# Patient Record
Sex: Female | Born: 1935 | Race: Black or African American | Hispanic: No | Marital: Single | Smoking: Never smoker
Health system: Southern US, Community
[De-identification: ages and names within clinical notes are randomized; demographics above are authoritative.]

## PROBLEM LIST (undated history)

## (undated) DIAGNOSIS — N183 Chronic kidney disease, stage 3 unspecified: Secondary | ICD-10-CM

## (undated) DIAGNOSIS — C801 Malignant (primary) neoplasm, unspecified: Secondary | ICD-10-CM

## (undated) DIAGNOSIS — E785 Hyperlipidemia, unspecified: Secondary | ICD-10-CM

## (undated) DIAGNOSIS — F0390 Unspecified dementia without behavioral disturbance: Secondary | ICD-10-CM

## (undated) DIAGNOSIS — I10 Essential (primary) hypertension: Secondary | ICD-10-CM

## (undated) DIAGNOSIS — K219 Gastro-esophageal reflux disease without esophagitis: Secondary | ICD-10-CM

## (undated) HISTORY — PX: OTHER SURGICAL HISTORY: SHX169

---

## 2004-09-07 ENCOUNTER — Emergency Department: Payer: Self-pay | Admitting: Emergency Medicine

## 2005-04-20 ENCOUNTER — Emergency Department: Payer: Self-pay | Admitting: General Practice

## 2005-05-12 ENCOUNTER — Ambulatory Visit: Payer: Self-pay | Admitting: Internal Medicine

## 2009-08-04 ENCOUNTER — Ambulatory Visit: Payer: Self-pay | Admitting: Family Medicine

## 2009-08-25 ENCOUNTER — Ambulatory Visit: Payer: Self-pay | Admitting: Family Medicine

## 2009-09-05 ENCOUNTER — Emergency Department: Payer: Self-pay | Admitting: Emergency Medicine

## 2009-11-12 ENCOUNTER — Ambulatory Visit: Payer: Self-pay | Admitting: Family Medicine

## 2009-12-24 ENCOUNTER — Ambulatory Visit: Payer: Self-pay | Admitting: Family Medicine

## 2009-12-27 ENCOUNTER — Emergency Department: Payer: Self-pay | Admitting: Emergency Medicine

## 2010-03-16 ENCOUNTER — Emergency Department: Payer: Self-pay | Admitting: Emergency Medicine

## 2010-06-01 IMAGING — CT CT HEAD WITHOUT CONTRAST
2 series · 15 of 30 positions shown, 19 images · non-contrast
Comparison: none

REASON FOR EXAM: hypertensive, headache
COMMENTS:

PROCEDURE:     CT  - CT HEAD WITHOUT CONTRAST  - December 27, 2009  [DATE]
RESULT:     Comparison:  09/05/2009
TECHNIQUE: Multiple axial images from the foramen magnum to the vertex were
obtained without IV contrast.

[Series 4: without · axial · non-contrast · 0.46mm/px · z∈[-684,-558]mm · 13 of 31 slices shown, 17 images]
[im 3/31  brain]
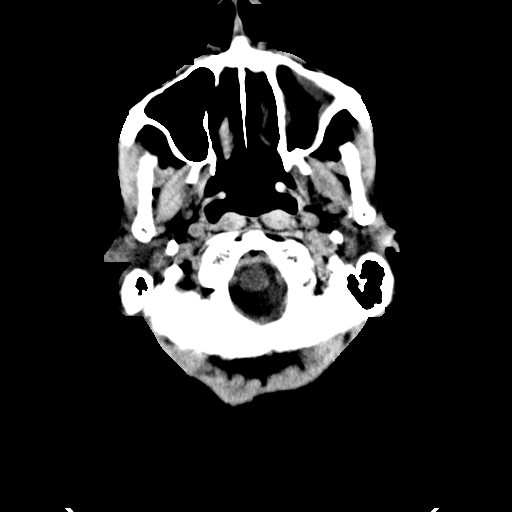
[im 3/31  bone]
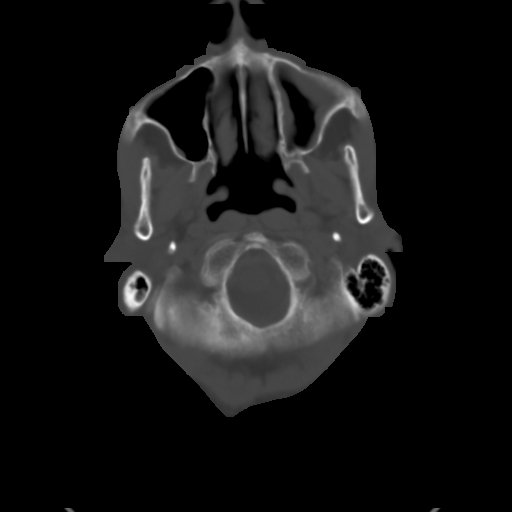
[im 5/31  brain]
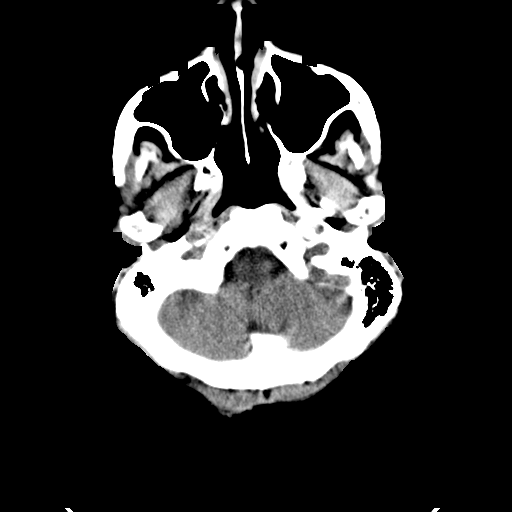
[im 7/31  brain]
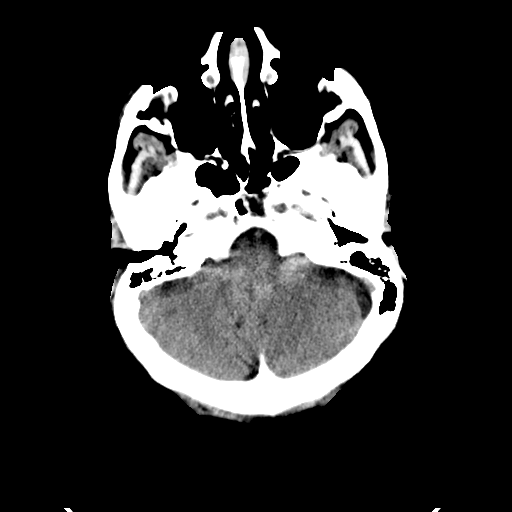
[im 9/31  brain]
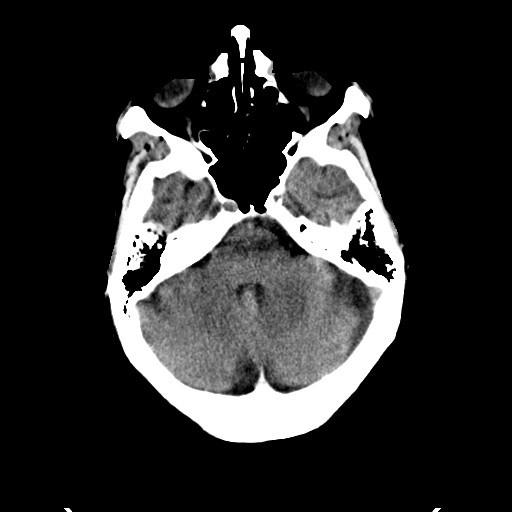
[im 11/31  brain]
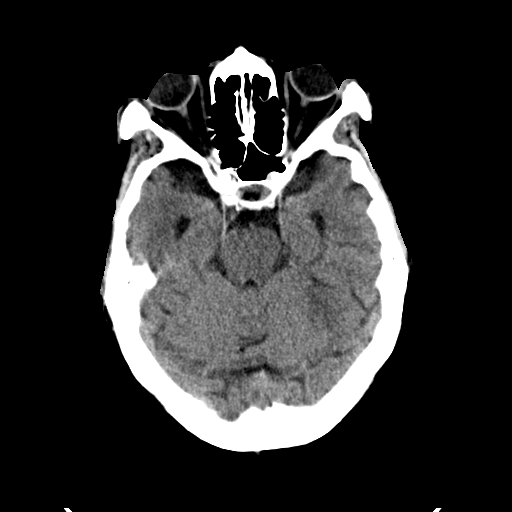
[im 11/31  bone]
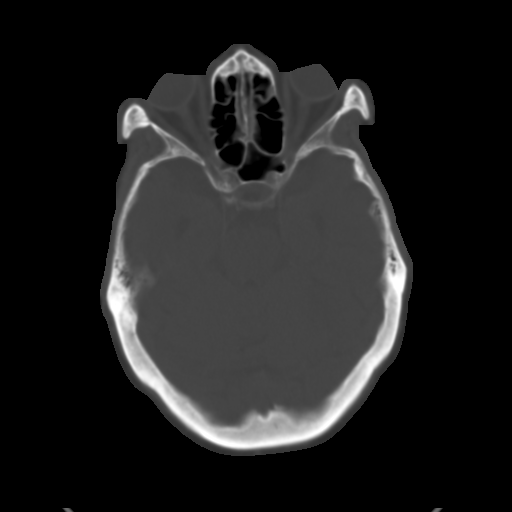
[im 13/31  brain]
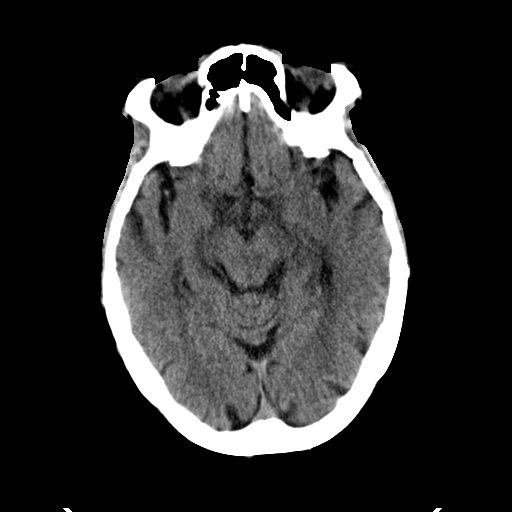
[im 16/31  brain]
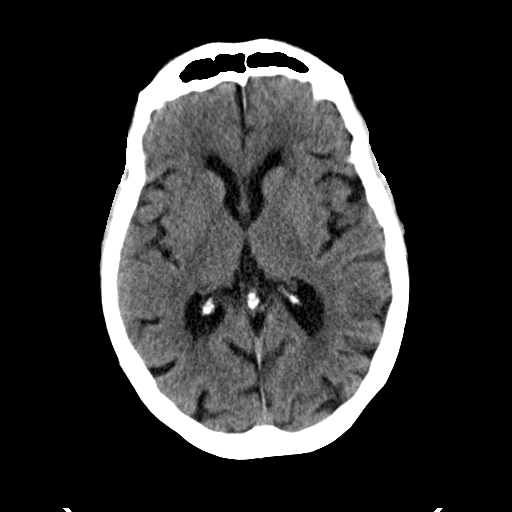
[im 18/31  brain]
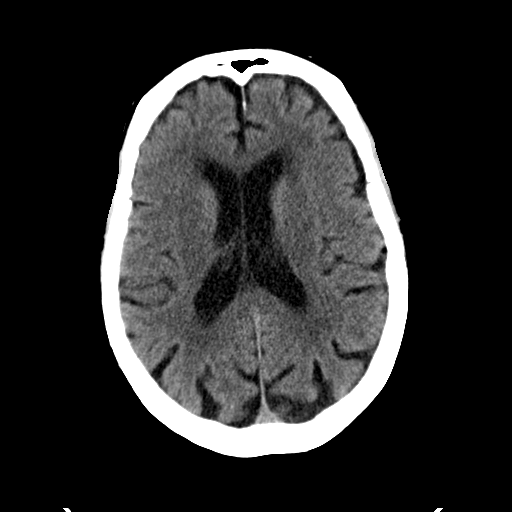
[im 20/31  brain]
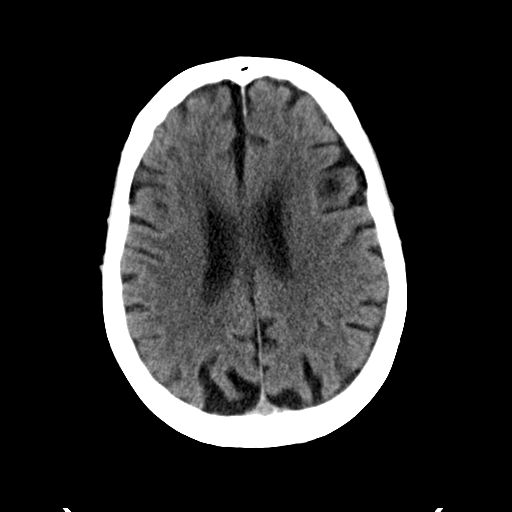
[im 20/31  bone]
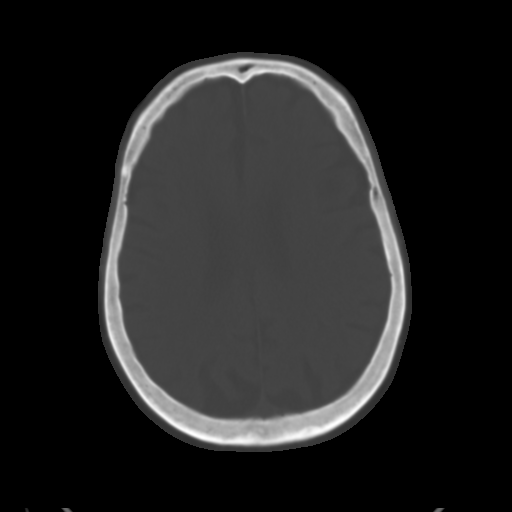
[im 22/31  brain]
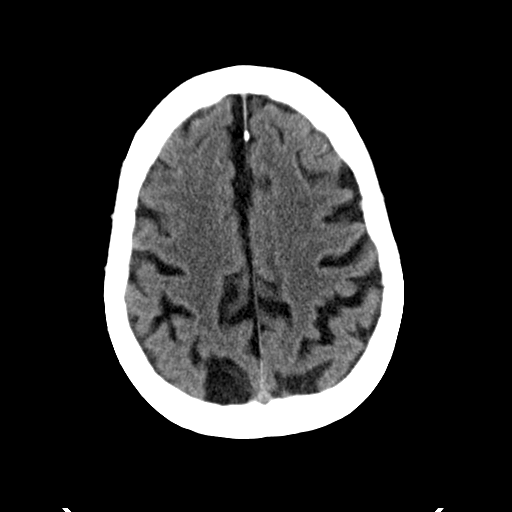
[im 24/31  brain]
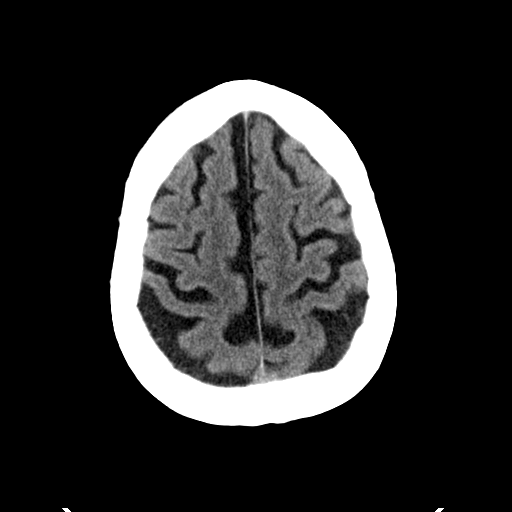
[im 26/31  brain]
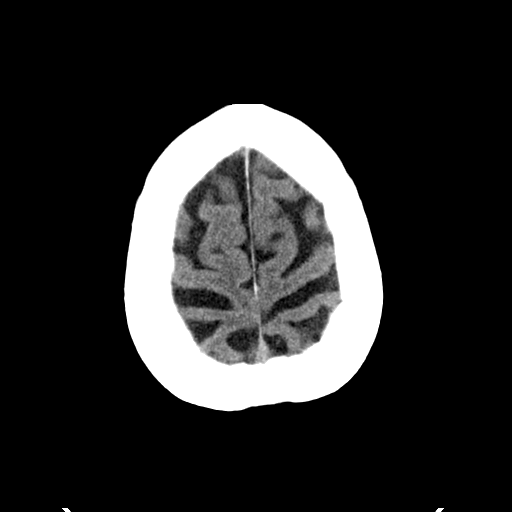
[im 28/31  brain]
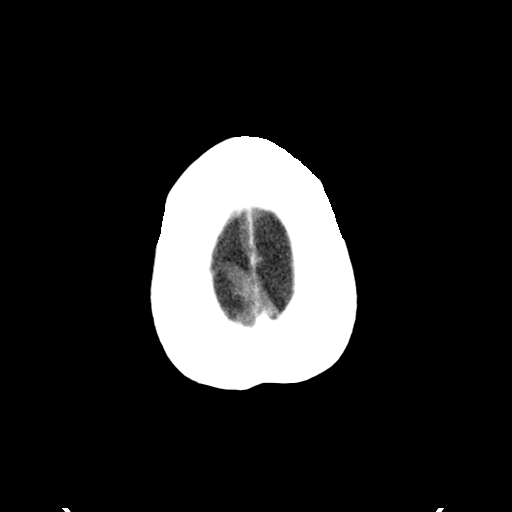
[im 28/31  bone]
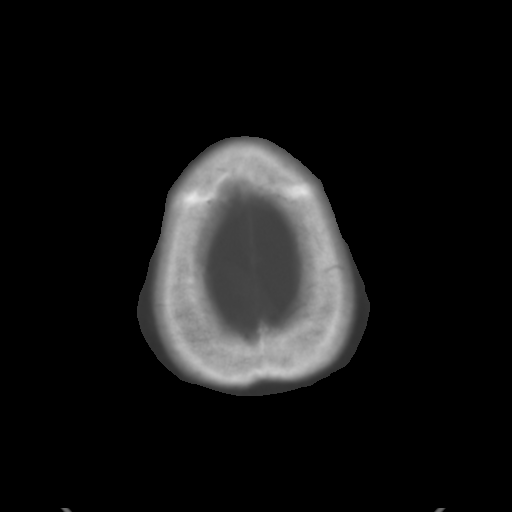

[Series 5: bone · axial · 0.46mm/px · z∈[-684,-664]mm · 2 of 31 slices shown]
[im 3/31  bone]
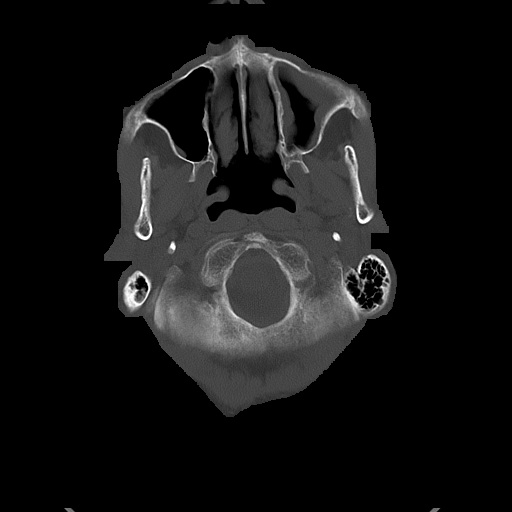
[im 7/31  bone]
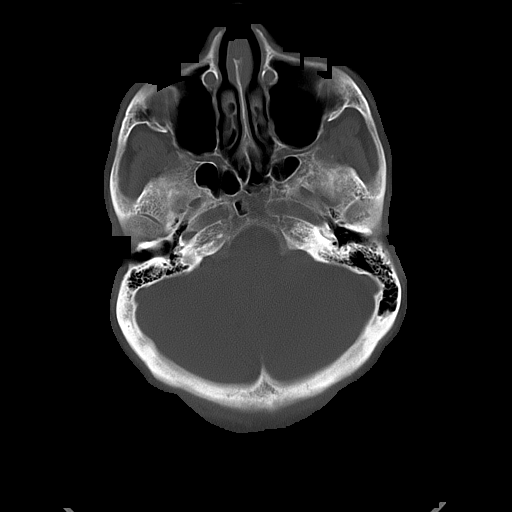

[15 of 30 positions shown; findings below may reference images not displayed]

FINDINGS: There is no evidence of mass effect, midline shift, or extra-axial fluid
collections.  There is no evidence of a space-occupying lesion or
intracranial hemorrhage. There is no evidence of a cortical-based area of
acute infarction. There is generalized cerebral atrophy. There is
periventricular white matter low attenuation likely secondary to
microangiopathy.

The ventricles and sulci are appropriate for the patient's age. The basal
cisterns are patent.

Visualized portions of the orbits are unremarkable. The visualized portions
of the paranasal sinuses and mastoid air cells are unremarkable.
Cerebrovascular atherosclerotic calcifications are noted.

The osseous structures are unremarkable.
IMPRESSION: No acute intracranial process.

## 2010-07-05 ENCOUNTER — Emergency Department: Payer: Self-pay | Admitting: Emergency Medicine

## 2010-12-10 ENCOUNTER — Emergency Department: Payer: Self-pay | Admitting: Emergency Medicine

## 2010-12-12 ENCOUNTER — Emergency Department: Payer: Self-pay | Admitting: Emergency Medicine

## 2010-12-22 ENCOUNTER — Emergency Department: Payer: Self-pay | Admitting: Emergency Medicine

## 2012-04-27 ENCOUNTER — Emergency Department: Payer: Self-pay | Admitting: Internal Medicine

## 2012-06-29 ENCOUNTER — Ambulatory Visit: Payer: Self-pay | Admitting: Oncology

## 2012-07-06 ENCOUNTER — Ambulatory Visit: Payer: Self-pay | Admitting: Oncology

## 2012-09-05 ENCOUNTER — Ambulatory Visit: Payer: Self-pay | Admitting: Oncology

## 2012-09-05 DIAGNOSIS — C801 Malignant (primary) neoplasm, unspecified: Secondary | ICD-10-CM

## 2012-09-05 HISTORY — DX: Malignant (primary) neoplasm, unspecified: C80.1

## 2013-09-22 ENCOUNTER — Emergency Department: Payer: Self-pay | Admitting: Emergency Medicine

## 2015-01-09 ENCOUNTER — Encounter: Payer: Self-pay | Admitting: Emergency Medicine

## 2015-01-09 ENCOUNTER — Emergency Department: Payer: Medicare Other

## 2015-01-09 ENCOUNTER — Other Ambulatory Visit: Payer: Self-pay

## 2015-01-09 ENCOUNTER — Emergency Department
Admission: EM | Admit: 2015-01-09 | Discharge: 2015-01-10 | Disposition: A | Discharge status: Home / Self Care | Payer: Medicare Other | Attending: Emergency Medicine | Admitting: Emergency Medicine

## 2015-01-09 DIAGNOSIS — R1110 Vomiting, unspecified: Secondary | ICD-10-CM | POA: Insufficient documentation

## 2015-01-09 DIAGNOSIS — R1084 Generalized abdominal pain: Secondary | ICD-10-CM

## 2015-01-09 DIAGNOSIS — R1111 Vomiting without nausea: Secondary | ICD-10-CM

## 2015-01-09 HISTORY — DX: Unspecified dementia, unspecified severity, without behavioral disturbance, psychotic disturbance, mood disturbance, and anxiety: F03.90

## 2015-01-09 HISTORY — DX: Malignant (primary) neoplasm, unspecified: C80.1

## 2015-01-09 LAB — CBC WITH DIFFERENTIAL/PLATELET
BASOS PCT: 1 %
Basophils Absolute: 0 10*3/uL (ref 0–0.1)
Eosinophils Absolute: 0.1 10*3/uL (ref 0–0.7)
Eosinophils Relative: 1 %
HCT: 31.8 % — ABNORMAL LOW (ref 35.0–47.0)
Hemoglobin: 10.5 g/dL — ABNORMAL LOW (ref 12.0–16.0)
Lymphocytes Relative: 26 %
Lymphs Abs: 2.1 10*3/uL (ref 1.0–3.6)
MCH: 29.6 pg (ref 26.0–34.0)
MCHC: 33 g/dL (ref 32.0–36.0)
MCV: 89.7 fL (ref 80.0–100.0)
Monocytes Absolute: 0.7 10*3/uL (ref 0.2–0.9)
Monocytes Relative: 8 %
NEUTROS PCT: 64 %
Neutro Abs: 5.3 10*3/uL (ref 1.4–6.5)
PLATELETS: 180 10*3/uL (ref 150–440)
RBC: 3.54 MIL/uL — ABNORMAL LOW (ref 3.80–5.20)
RDW: 13.6 % (ref 11.5–14.5)
WBC: 8.2 10*3/uL (ref 3.6–11.0)

## 2015-01-09 LAB — COMPREHENSIVE METABOLIC PANEL
ALBUMIN: 3.7 g/dL (ref 3.5–5.0)
ALT: 15 U/L (ref 14–54)
AST: 33 U/L (ref 15–41)
Alkaline Phosphatase: 83 U/L (ref 38–126)
Anion gap: 8 (ref 5–15)
BUN: 41 mg/dL — AB (ref 6–20)
CALCIUM: 8.9 mg/dL (ref 8.9–10.3)
CO2: 21 mmol/L — ABNORMAL LOW (ref 22–32)
CREATININE: 1.55 mg/dL — AB (ref 0.44–1.00)
Chloride: 111 mmol/L (ref 101–111)
GFR calc non Af Amer: 29 mL/min — ABNORMAL LOW (ref >60)
GFR, EST AFRICAN AMERICAN: 33 mL/min — AB (ref >60)
GLUCOSE: 139 mg/dL — AB (ref 65–99)
POTASSIUM: 4.4 mmol/L (ref 3.5–5.1)
Sodium: 140 mmol/L (ref 135–145)
Total Bilirubin: 0.6 mg/dL (ref 0.3–1.2)
Total Protein: 6.7 g/dL (ref 6.5–8.1)

## 2015-01-09 LAB — APTT: aPTT: 25 seconds (ref 24–36)

## 2015-01-09 LAB — LIPASE, BLOOD: Lipase: 66 U/L — ABNORMAL HIGH (ref 22–51)

## 2015-01-09 LAB — PROTIME-INR
INR: 0.98
Prothrombin Time: 13.2 seconds (ref 11.4–15.0)

## 2015-01-09 MED ORDER — SODIUM CHLORIDE 0.9 % IV SOLN
1000.0000 mL | Freq: Once | INTRAVENOUS | Status: AC
  Administered 2015-01-09: 1000 mL via INTRAVENOUS

## 2015-01-09 MED ORDER — ONDANSETRON HCL 4 MG/2ML IJ SOLN
INTRAMUSCULAR | Status: AC
  Administered 2015-01-09: 4 mg via INTRAVENOUS
  Filled 2015-01-09: qty 2

## 2015-01-09 MED ORDER — ONDANSETRON HCL 4 MG/2ML IJ SOLN
4.0000 mg | Freq: Once | INTRAMUSCULAR | Status: AC
  Administered 2015-01-09: 4 mg via INTRAVENOUS

## 2015-01-09 MED ORDER — IOHEXOL 300 MG/ML  SOLN
75.0000 mL | Freq: Once | INTRAMUSCULAR | Status: AC | PRN
  Administered 2015-01-09: 75 mL via INTRAVENOUS

## 2015-01-09 MED ORDER — IOHEXOL 240 MG/ML SOLN
25.0000 mL | Freq: Once | INTRAMUSCULAR | Status: AC | PRN
  Administered 2015-01-09: 25 mL via ORAL

## 2015-01-09 NOTE — ED Provider Notes (Addendum)
[facility] Emergency Department Provider Note    ____________________________________________  Time seen: On arrival with EMS.  I have reviewed the triage vital signs and the nursing notes.   HISTORY  Chief Complaint Emesis       HPI Hayley Campbell is a 81 y.o. female with a history of dementia and breast cancer who presents with vomiting.The patient denies any pain at this time. She vomited twice for EMS en route was found to be hypotensive. The vomitus was not bloody. The patient does not report any bloody stools as well.     Past Medical History  Diagnosis Date  . Cancer 2014    left breast cancer  . Dementia     There are no active problems to display for this patient.   Past Surgical History  Procedure Laterality Date  . None      No current outpatient prescriptions on file.  Allergies Review of patient's allergies indicates no known allergies.  History reviewed. No pertinent family history.  Social History History  Substance Use Topics  . Smoking status: Never Smoker   . Smokeless tobacco: Not on file  . Alcohol Use: No    Review of Systems  Constitutional: Negative for fever. Eyes: Negative for visual changes. ENT: Negative for sore throat. Cardiovascular: Negative for chest pain. Respiratory: Negative for shortness of breath. Gastrointestinal: Positive for vomiting.   Genitourinary: Negative for dysuria. Musculoskeletal: Negative for back pain. Skin: Negative for rash. Neurological: Negative for headaches, focal weakness or numbness.   10-point ROS otherwise negative.  ____________________________________________   PHYSICAL EXAM:  VITAL SIGNS: ED Triage Vitals  Enc Vitals Group     BP 01/09/15 2116 94/49 mmHg     Pulse Rate 01/09/15 2116 82     Resp --      Temp 01/09/15 2116 97.7 F (36.5 C)     Temp Source 01/09/15 2116 Oral     SpO2 01/09/15 2112 94 %     Weight 01/09/15 2116 144 lb 2.9 oz  (65.4 kg)     Height 01/09/15 2116 5\' 1"  (1.549 m)     Head Cir --      Peak Flow --      Pain Score --      Pain Loc --      Pain Edu? --      Excl. in [place]? --      Constitutional: Alert and oriented. However not oriented to the year which is the patient's baseline. Well appearing and in no distress. Eyes: Conjunctivae are normal. PERRL. Normal extraocular movements. ENT   Head: Normocephalic and atraumatic.   Nose: No congestion/rhinnorhea.   Mouth/Throat: Mucous membranes are moist.   Neck: No stridor. Hematological/Lymphatic/Immunilogical: No cervical lymphadenopathy. Cardiovascular: Normal rate, regular rhythm. Normal and symmetric distal pulses are present in all extremities. No murmurs, rubs, or gallops. Respiratory: Normal respiratory effort without tachypnea nor retractions. Breath sounds are clear and equal bilaterally. No wheezes/rales/rhonchi. Gastrointestinal: Diffusely tender abdomen which is soft and nondistended. Musculoskeletal: Nontender with normal range of motion in all extremities. No joint effusions.  No lower extremity tenderness nor edema. Neurologic:  Normal speech and language. No gross focal neurologic deficits are appreciated. Speech is normal. No gait instability. Skin:  Skin is warm, dry and intact. No rash noted. Psychiatric: Mood and affect are normal. Speech and behavior are normal. Patient exhibits appropriate insight and judgment.  ____________________________________________    LABS (pertinent positives/negatives)  Labs Reviewed  CBC WITH DIFFERENTIAL/PLATELET -  Abnormal; Notable for the following:    RBC 3.54 (*)    Hemoglobin 10.5 (*)    HCT 31.8 (*)    All other components within normal limits  COMPREHENSIVE METABOLIC PANEL - Abnormal; Notable for the following:    CO2 21 (*)    Glucose, Bld 139 (*)    BUN 41 (*)    Creatinine, Ser 1.55 (*)    GFR calc non Af Amer 29 (*)    GFR calc Af Amer 33 (*)    All other components  within normal limits  LIPASE, BLOOD - Abnormal; Notable for the following:    Lipase 66 (*)    All other components within normal limits  APTT  PROTIME-INR  URINALYSIS COMPLETEWITH MICROSCOPIC ([facility])   TROPONIN I   No previous for comparison.  ____________________________________________   EKG   Date: 01/10/2015  Rate: 82  Rhythm: normal sinus rhythm  QRS Axis: normal  Intervals: normal  ST/T Wave abnormalities: normal  Conduction Disutrbances: none  Narrative Interpretation: unremarkable      ____________________________________________    RADIOLOGY  2 cm cystic lesion in the pancreatic head  ____________________________________________   PROCEDURES   ____________________________________________   INITIAL IMPRESSION / ASSESSMENT AND PLAN / ED COURSE  Pertinent labs & imaging results that were available during my care of the patient were reviewed by me and considered in my medical decision making (see chart for details).  ----------------------------------------- 1:02 AM on 01/10/2015 -----------------------------------------  Patient is resting comfortably now. Is not having any more vomiting or complaining of pain. Was able tolerate by mouth fluids as well as applesauce. Still pending troponin and urinalysis. The patient's niece at the bedside Her the labs and imaging that we have obtained so far. He says that she has a known anemia. I also explained her kidney function was decreased. I spent with the patient is able to tolerate her food and her urine and other labs come back and a reassuring patient will likely be able to go home. The niece understands this plan and is willing to comply. Dr. Owens Shark to follow-up with remaining lab work.  ____________________________________________   FINAL CLINICAL IMPRESSION(S) / ED DIAGNOSES  Acute vomiting and abdominal pain. Initial visit.   Doran Stabler, MD 01/10/15 0103  Senna bowel movement with  grossly brown stool. Also observe vomiting earlier in the visit without any blood in the vomitus.  Doran Stabler, MD 01/10/15 [phone]

## 2015-01-09 NOTE — ED Notes (Signed)
Pt arrived via EMS from [facility]. Per EMS pt was in wheelchair and then started vomiting uncontrollably. Vomited x2 . Pt is alert to person and place.

## 2015-01-09 NOTE — ED Notes (Signed)
With CT 

## 2015-01-10 LAB — TROPONIN I: Troponin I: <0.03 ng/mL (ref <0.031)

## 2015-01-10 NOTE — ED Notes (Signed)
Pt. Was able to eat 4oz of applesauce and 8oz of grape juice and hold down for PO challenge.

## 2015-01-10 NOTE — ED Notes (Addendum)
Attempts for in and out unsuccessful.  Pt. Had medium bowel movement.  Cleaned up and changed pt.

## 2015-01-10 NOTE — Discharge Instructions (Signed)
Abdominal Pain °Many things can cause abdominal pain. Usually, abdominal pain is not caused by a disease and will improve without treatment. It can often be observed and treated at home. Your health care provider will do a physical exam and possibly order blood tests and X-rays to help determine the seriousness of your pain. However, in many cases, more time must pass before a clear cause of the pain can be found. Before that point, your health care provider may not know if you need more testing or further treatment. °HOME CARE INSTRUCTIONS  °Monitor your abdominal pain for any changes. The following actions may help to alleviate any discomfort you are experiencing: °· Only take over-the-counter or prescription medicines as directed by your health care provider. °· Do not take laxatives unless directed to do so by your health care provider. °· Try a clear liquid diet (broth, tea, or water) as directed by your health care provider. Slowly move to a bland diet as tolerated. °SEEK MEDICAL CARE IF: °· You have unexplained abdominal pain. °· You have abdominal pain associated with nausea or diarrhea. °· You have pain when you urinate or have a bowel movement. °· You experience abdominal pain that wakes you in the night. °· You have abdominal pain that is worsened or improved by eating food. °· You have abdominal pain that is worsened with eating fatty foods. °· You have a fever. °SEEK IMMEDIATE MEDICAL CARE IF:  °· Your pain does not go away within 2 hours. °· You keep throwing up (vomiting). °· Your pain is felt only in portions of the abdomen, such as the right side or the left lower portion of the abdomen. °· You pass bloody or black tarry stools. °MAKE SURE YOU: °· Understand these instructions.   °· Will watch your condition.   °· Will get help right away if you are not doing well or get worse.   °Document Released: 06/01/2005 Document Revised: 08/27/2013 Document Reviewed: 05/01/2013 °[employer]® Patient Information  ©2015 [employer]. This information is not intended to replace advice given to you by your health care provider. Make sure you discuss any questions you have with your health care provider. ° °Nausea and Vomiting °Nausea is a sick feeling that often comes before throwing up (vomiting). Vomiting is a reflex where stomach contents come out of your mouth. Vomiting can cause severe loss of body fluids (dehydration). Children and elderly adults can become dehydrated quickly, especially if they also have diarrhea. Nausea and vomiting are symptoms of a condition or disease. It is important to find the cause of your symptoms. °CAUSES  °· Direct irritation of the stomach lining. This irritation can result from increased acid production (gastroesophageal reflux disease), infection, food poisoning, taking certain medicines (such as nonsteroidal anti-inflammatory drugs), alcohol use, or tobacco use. °· Signals from the brain. These signals could be caused by a headache, heat exposure, an inner ear disturbance, increased pressure in the brain from injury, infection, a tumor, or a concussion, pain, emotional stimulus, or metabolic problems. °· An obstruction in the gastrointestinal tract (bowel obstruction). °· Illnesses such as diabetes, hepatitis, gallbladder problems, appendicitis, kidney problems, cancer, sepsis, atypical symptoms of a heart attack, or eating disorders. °· Medical treatments such as chemotherapy and radiation. °· Receiving medicine that makes you sleep (general anesthetic) during surgery. °DIAGNOSIS °Your caregiver may ask for tests to be done if the problems do not improve after a few days. Tests may also be done if symptoms are severe or if the reason for the nausea   and vomiting is not clear. Tests may include: °· Urine tests. °· Blood tests. °· Stool tests. °· Cultures (to look for evidence of infection). °· X-rays or other imaging studies. °Test results can help your caregiver make decisions about  treatment or the need for additional tests. °TREATMENT °You need to stay well hydrated. Drink frequently but in small amounts. You may wish to drink water, sports drinks, clear broth, or eat frozen ice pops or gelatin dessert to help stay hydrated. When you eat, eating slowly may help prevent nausea. There are also some antinausea medicines that may help prevent nausea. °HOME CARE INSTRUCTIONS  °· Take all medicine as directed by your caregiver. °· If you do not have an appetite, do not force yourself to eat. However, you must continue to drink fluids. °· If you have an appetite, eat a normal diet unless your caregiver tells you differently. °¨ Eat a variety of complex carbohydrates (rice, wheat, potatoes, bread), lean meats, yogurt, fruits, and vegetables. °¨ Avoid high-fat foods because they are more difficult to digest. °· Drink enough water and fluids to keep your urine clear or pale yellow. °· If you are dehydrated, ask your caregiver for specific rehydration instructions. Signs of dehydration may include: °¨ Severe thirst. °¨ Dry lips and mouth. °¨ Dizziness. °¨ Dark urine. °¨ Decreasing urine frequency and amount. °¨ Confusion. °¨ Rapid breathing or pulse. °SEEK IMMEDIATE MEDICAL CARE IF:  °· You have blood or brown flecks (like coffee grounds) in your vomit. °· You have black or bloody stools. °· You have a severe headache or stiff neck. °· You are confused. °· You have severe abdominal pain. °· You have chest pain or trouble breathing. °· You do not urinate at least once every 8 hours. °· You develop cold or clammy skin. °· You continue to vomit for longer than 24 to 48 hours. °· You have a fever. °MAKE SURE YOU:  °· Understand these instructions. °· Will watch your condition. °· Will get help right away if you are not doing well or get worse. °Document Released: 08/22/2005 Document Revised: 11/14/2011 Document Reviewed: 01/19/2011 °ExitCare® Patient Information ©2015 [employer]. This information is not  intended to replace advice given to you by your health care provider. Make sure you discuss any questions you have with your health care provider. ° °

## 2015-11-26 ENCOUNTER — Encounter: Payer: Self-pay | Admitting: Emergency Medicine

## 2015-11-26 ENCOUNTER — Emergency Department
Admission: EM | Admit: 2015-11-26 | Discharge: 2015-11-26 | Disposition: A | Discharge status: Skilled Nursing Facility | Payer: Medicare Other | Attending: Emergency Medicine | Admitting: Emergency Medicine

## 2015-11-26 ENCOUNTER — Emergency Department: Payer: Medicare Other

## 2015-11-26 DIAGNOSIS — R531 Weakness: Secondary | ICD-10-CM

## 2015-11-26 DIAGNOSIS — I959 Hypotension, unspecified: Secondary | ICD-10-CM | POA: Diagnosis present

## 2015-11-26 DIAGNOSIS — F0390 Unspecified dementia without behavioral disturbance: Secondary | ICD-10-CM | POA: Insufficient documentation

## 2015-11-26 DIAGNOSIS — Z853 Personal history of malignant neoplasm of breast: Secondary | ICD-10-CM | POA: Diagnosis not present

## 2015-11-26 LAB — CBC WITH DIFFERENTIAL/PLATELET
Basophils Absolute: 0 10*3/uL (ref 0–0.1)
Basophils Relative: 0 %
Eosinophils Absolute: 0.1 10*3/uL (ref 0–0.7)
Eosinophils Relative: 0 %
HEMATOCRIT: 31.7 % — AB (ref 35.0–47.0)
HEMOGLOBIN: 10.4 g/dL — AB (ref 12.0–16.0)
LYMPHS ABS: 1.2 10*3/uL (ref 1.0–3.6)
Lymphocytes Relative: 11 %
MCH: 29.3 pg (ref 26.0–34.0)
MCHC: 32.7 g/dL (ref 32.0–36.0)
MCV: 89.6 fL (ref 80.0–100.0)
MONOS PCT: 5 %
Monocytes Absolute: 0.6 10*3/uL (ref 0.2–0.9)
NEUTROS ABS: 9.5 10*3/uL — AB (ref 1.4–6.5)
NEUTROS PCT: 84 %
Platelets: 197 10*3/uL (ref 150–440)
RBC: 3.54 MIL/uL — ABNORMAL LOW (ref 3.80–5.20)
RDW: 13.4 % (ref 11.5–14.5)
WBC: 11.4 10*3/uL — ABNORMAL HIGH (ref 3.6–11.0)

## 2015-11-26 LAB — COMPREHENSIVE METABOLIC PANEL
ALK PHOS: 83 U/L (ref 38–126)
ALT: 14 U/L (ref 14–54)
ANION GAP: 5 (ref 5–15)
AST: 25 U/L (ref 15–41)
Albumin: 3.5 g/dL (ref 3.5–5.0)
BILIRUBIN TOTAL: 0.7 mg/dL (ref 0.3–1.2)
BUN: 40 mg/dL — ABNORMAL HIGH (ref 6–20)
CALCIUM: 8.7 mg/dL — AB (ref 8.9–10.3)
CO2: 19 mmol/L — ABNORMAL LOW (ref 22–32)
Chloride: 111 mmol/L (ref 101–111)
Creatinine, Ser: 1.15 mg/dL — ABNORMAL HIGH (ref 0.44–1.00)
GFR calc non Af Amer: 41 mL/min — ABNORMAL LOW (ref >60)
GFR, EST AFRICAN AMERICAN: 47 mL/min — AB (ref >60)
Glucose, Bld: 136 mg/dL — ABNORMAL HIGH (ref 65–99)
Potassium: 4.9 mmol/L (ref 3.5–5.1)
Sodium: 135 mmol/L (ref 135–145)
TOTAL PROTEIN: 6.6 g/dL (ref 6.5–8.1)

## 2015-11-26 LAB — URINALYSIS COMPLETE WITH MICROSCOPIC (ARMC ONLY)
Bilirubin Urine: NEGATIVE
GLUCOSE, UA: NEGATIVE mg/dL
Ketones, ur: NEGATIVE mg/dL
Leukocytes, UA: NEGATIVE
NITRITE: NEGATIVE
Protein, ur: NEGATIVE mg/dL
SPECIFIC GRAVITY, URINE: 1.01 (ref 1.005–1.030)
pH: 6 (ref 5.0–8.0)

## 2015-11-26 LAB — TROPONIN I: Troponin I: <0.03 ng/mL (ref <0.031)

## 2015-11-26 MED ORDER — DEXTROSE 5 % IV SOLN
1.0000 g | Freq: Once | INTRAVENOUS | Status: AC
  Administered 2015-11-26: 1 g via INTRAVENOUS
  Filled 2015-11-26: qty 10

## 2015-11-26 MED ORDER — SODIUM CHLORIDE 0.9 % IV SOLN
1000.0000 mL | Freq: Once | INTRAVENOUS | Status: AC
  Administered 2015-11-26: 1000 mL via INTRAVENOUS

## 2015-11-26 NOTE — Discharge Instructions (Signed)

## 2015-11-26 NOTE — ED Notes (Signed)
Attempted in/out cath 3 times unsuccessfully.  Dr. Jimmye Norman aware.  Pt placed on bedpan to hopefully obtain a sample.

## 2015-11-26 NOTE — ED Notes (Signed)
Pt lives in [facility] skilled nursing facility and was found unresponsive this morning.  Staff states that her coloring is different and is unable to stand (baseline walks with no devices).  EMS reports BP of 74/40 with manual 80/40.  BG normal in EMS truck.  EMS started normal saline bolus.  Pt has history of hypertension and dementia.

## 2015-11-26 NOTE — ED Provider Notes (Signed)
[facility] Emergency Department Provider Note     Time seen: ----------------------------------------- 8:20 AM on 11/26/2015 -----------------------------------------  L5 caveat: Review of systems and history is limited by dementia  I have reviewed the triage vital signs and the nursing notes.   HISTORY  Chief Complaint Hypotension    HPI Hayley Campbell is a 82 y.o. female who presents to ER being brought from a nursing home after she was found unresponsive this morning. Staff states she looked different and was unable to stand. She typically walks without any assistance. EMS arrived to find blood pressure low. EMS started a saline bolus, patient denies any complaints at this time but is demented.   Past Medical History  Diagnosis Date  . Cancer [facility]) 2014    left breast cancer  . Dementia     There are no active problems to display for this patient.   Past Surgical History  Procedure Laterality Date  . None      Allergies Review of patient's allergies indicates no known allergies.  Social History Social History  Substance Use Topics  . Smoking status: Never Smoker   . Smokeless tobacco: None  . Alcohol Use: No    Review of Systems Unknown at this time  ____________________________________________   PHYSICAL EXAM:  VITAL SIGNS: ED Triage Vitals  Enc Vitals Group     BP 11/26/15 0800 108/59 mmHg     Pulse Rate 11/26/15 0800 77     Resp --      Temp 11/26/15 0800 97.5 F (36.4 C)     Temp Source 11/26/15 0800 Oral     SpO2 11/26/15 0800 98 %     Weight 11/26/15 0800 130 lb (58.968 kg)     Height 11/26/15 0800 5\' 4"  (1.626 m)     Head Cir --      Peak Flow --      Pain Score --      Pain Loc --      Pain Edu? --      Excl. in [place]? --     Constitutional: Alert But disoriented, Well appearing and in no distress. Eyes: Conjunctivae are normal. PERRL. Normal extraocular movements. ENT   Head: Normocephalic and  atraumatic.   Nose: No congestion/rhinnorhea.   Mouth/Throat: Mucous membranes are moist.   Neck: No stridor. Cardiovascular: Normal rate, regular rhythm. Normal and symmetric distal pulses are present in all extremities. No murmurs, rubs, or gallops. Respiratory: Normal respiratory effort without tachypnea nor retractions. Breath sounds are clear and equal bilaterally. No wheezes/rales/rhonchi. Gastrointestinal: Soft and nontender. No distention. No abdominal bruits.  Musculoskeletal: Nontender with normal range of motion in all extremities. No joint effusions.  No lower extremity tenderness nor edema. Neurologic:  Normal speech and language. No gross focal neurologic deficits are appreciated.  Skin:  Skin is warm, dry and intact. No rash noted. Psychiatric: Mood and affect are normal.  ____________________________________________  EKG: Interpreted by me. Normal sinus rhythm with a rate of 77 bpm, normal PR interval, normal QRS, normal QT interval. Normal axis.  ____________________________________________  ED COURSE:  Pertinent labs & imaging results that were available during my care of the patient were reviewed by me and considered in my medical decision making (see chart for details). Patient is no acute distress, will check basic labs, continue IV fluid bolus and reevaluate. ____________________________________________    LABS (pertinent positives/negatives)  Labs Reviewed  CBC WITH DIFFERENTIAL/PLATELET - Abnormal; Notable for the following:    WBC  11.4 (*)    RBC 3.54 (*)    Hemoglobin 10.4 (*)    HCT 31.7 (*)    Neutro Abs 9.5 (*)    All other components within normal limits  COMPREHENSIVE METABOLIC PANEL - Abnormal; Notable for the following:    CO2 19 (*)    Glucose, Bld 136 (*)    BUN 40 (*)    Creatinine, Ser 1.15 (*)    Calcium 8.7 (*)    GFR calc non Af Amer 41 (*)    GFR calc Af Amer 47 (*)    All other components within normal limits  URINALYSIS  COMPLETEWITH MICROSCOPIC ([facility] ONLY) - Abnormal; Notable for the following:    Color, Urine YELLOW (*)    APPearance CLEAR (*)    Hgb urine dipstick 3+ (*)    Bacteria, UA RARE (*)    Squamous Epithelial / LPF 6-30 (*)    All other components within normal limits  URINE CULTURE  TROPONIN I    RADIOLOGY  IMPRESSION: Limited study by poor inspiration. No infiltrate or pulmonary edema. Mild basilar atelectasis.  ____________________________________________  FINAL ASSESSMENT AND PLAN  Weakness  Plan: Patient with labs and imaging as dictated above. No clear etiology for the events that occurred earlier. She has been awake and alert and normotensive throughout her stay. She has received a liter fluid as well as a gram of Rocephin. I will advised close outpatient follow-up with her doctor.   Earleen Newport, MD   Earleen Newport, MD 11/26/15 [phone]

## 2015-11-28 LAB — URINE CULTURE: Special Requests: NORMAL

## 2016-01-29 ENCOUNTER — Emergency Department
Admission: EM | Admit: 2016-01-29 | Discharge: 2016-01-30 | Disposition: A | Discharge status: Home / Self Care | Payer: Medicare Other | Attending: Emergency Medicine | Admitting: Emergency Medicine

## 2016-01-29 DIAGNOSIS — Z853 Personal history of malignant neoplasm of breast: Secondary | ICD-10-CM | POA: Diagnosis not present

## 2016-01-29 DIAGNOSIS — R55 Syncope and collapse: Secondary | ICD-10-CM | POA: Diagnosis not present

## 2016-01-29 DIAGNOSIS — R531 Weakness: Secondary | ICD-10-CM | POA: Diagnosis present

## 2016-01-29 DIAGNOSIS — Z79899 Other long term (current) drug therapy: Secondary | ICD-10-CM | POA: Diagnosis not present

## 2016-01-29 LAB — CBC
HEMATOCRIT: 32.1 % — AB (ref 35.0–47.0)
Hemoglobin: 10.8 g/dL — ABNORMAL LOW (ref 12.0–16.0)
MCH: 29.8 pg (ref 26.0–34.0)
MCHC: 33.8 g/dL (ref 32.0–36.0)
MCV: 88.2 fL (ref 80.0–100.0)
PLATELETS: 224 10*3/uL (ref 150–440)
RBC: 3.64 MIL/uL — ABNORMAL LOW (ref 3.80–5.20)
RDW: 13.4 % (ref 11.5–14.5)
WBC: 8.1 10*3/uL (ref 3.6–11.0)

## 2016-01-29 LAB — BASIC METABOLIC PANEL
Anion gap: 6 (ref 5–15)
BUN: 53 mg/dL — AB (ref 6–20)
CO2: 23 mmol/L (ref 22–32)
CREATININE: 1.28 mg/dL — AB (ref 0.44–1.00)
Calcium: 9.7 mg/dL (ref 8.9–10.3)
Chloride: 111 mmol/L (ref 101–111)
GFR calc Af Amer: 41 mL/min — ABNORMAL LOW (ref >60)
GFR, EST NON AFRICAN AMERICAN: 36 mL/min — AB (ref >60)
GLUCOSE: 103 mg/dL — AB (ref 65–99)
POTASSIUM: 5.1 mmol/L (ref 3.5–5.1)
SODIUM: 140 mmol/L (ref 135–145)

## 2016-01-29 NOTE — ED Notes (Addendum)
Pt arrives to ED via [facility] from [facility] with c/o "being slumped over". EMS states the facility found the pt with "her chin to her chest" and it took "5 mins to arouse her". EMS reports BP of 118/74. Pt is alert, but confused to date, situation, and location. Pt's skin is WPD and respirations are even, regular, and unlabored.

## 2016-01-30 MED ORDER — FOSFOMYCIN TROMETHAMINE 3 G PO PACK: 3.0000 g | PACK | Freq: Once | ORAL | Status: DC

## 2016-01-30 MED ORDER — FOSFOMYCIN TROMETHAMINE 3 G PO PACK
3.0000 g | PACK | ORAL | Status: AC
  Administered 2016-01-30: 3 g via ORAL
  Filled 2016-01-30: qty 3

## 2016-01-30 MED ORDER — SODIUM CHLORIDE 0.9 % IV BOLUS (SEPSIS)
500.0000 mL | Freq: Once | INTRAVENOUS | Status: AC
  Administered 2016-01-30: 500 mL via INTRAVENOUS

## 2016-01-30 NOTE — ED Provider Notes (Signed)
[facility] Emergency Department Provider Note  ____________________________________________  Time seen: Approximately 2:56 AM  I have reviewed the triage vital signs and the nursing notes.   HISTORY  Chief Complaint Weakness    HPI Hayley Campbell is a 82 y.o. female presents for evaluation of an episode where she was found slumped forward and difficult to arouse.  The patient is not able to provide significant history or clear clinical picture, but healthcare power of attorney is present and she is able to provide. She reports that she was called by the nursing home as the patient was slumped forward and they had difficulty arousing her for about 3-5 minutes. She reports she is not sure if she actually wanted the patient is so they sent to the ER, but did ultimately decide to send her for evaluation.  In further discussion she reports that the patient is DO NOT RESUSCITATE, and not sure that she would even wish for her to be hospitalized.   Past Medical History  Diagnosis Date  . Cancer [facility]) 2014    left breast cancer  . Dementia     There are no active problems to display for this patient.   Past Surgical History  Procedure Laterality Date  . None      Current Outpatient Rx  Name  Route  Sig  Dispense  Refill  . Cholecalciferol (VITAMIN D3) 50000 units CAPS   Oral   Take 50,000 Units by mouth every 30 (thirty) days. On the 14th of every month         . fosfomycin (MONUROL) 3 g PACK   Oral   Take 3 g by mouth once. Please mix in 8 oz of water, take by mouth once   3 g   0   . letrozole (FEMARA) 2.5 MG tablet   Oral   Take 2.5 mg by mouth daily.         Marland Kitchen lisinopril (PRINIVIL,ZESTRIL) 40 MG tablet   Oral   Take 40 mg by mouth daily.         Marland Kitchen omeprazole (PRILOSEC) 20 MG capsule   Oral   Take 20 mg by mouth daily. Take one hour before breakfast         . spironolactone (ALDACTONE) 25 MG tablet   Oral   Take 25 mg by  mouth daily.         . traZODone (DESYREL) 50 MG tablet   Oral   Take 25 mg by mouth at bedtime.           Allergies Darvon  No family history on file.  Social History Social History  Substance Use Topics  . Smoking status: Never Smoker   . Smokeless tobacco: Not on file  . Alcohol Use: No    Review of Systems EM caveat: Patient unable to provide history due to dementia Per caretaker patient and her regular state of health, but does have episodes where she will have weakness the last for a few minutes or some staring-type spells from time to time without clear diagnosis.  ____________________________________________   PHYSICAL EXAM:  VITAL SIGNS: ED Triage Vitals  Enc Vitals Group     BP 01/29/16 2149 101/43 mmHg     Pulse Rate 01/29/16 2149 81     Resp 01/29/16 2149 16     Temp 01/29/16 2149 98 F (36.7 C)     Temp Source 01/29/16 2149 Oral     SpO2 01/29/16 2149 100 %  Weight 01/29/16 2149 138 lb 11.2 oz (62.914 kg)     Height 01/29/16 2149 5' (1.524 m)     Head Cir --      Peak Flow --      Pain Score 01/29/16 2150 0     Pain Loc --      Pain Edu? --      Excl. in [place]? --    Constitutional: Alert and orientedTo self and caretaker but not to place or date. Well appearing and in no acute distress. She sits up, follows commands and is conversant. Eyes: Conjunctivae are normal. PERRL. EOMI. Head: Atraumatic. Nose: No congestion/rhinnorhea. Mouth/Throat: Mucous membranes are slightly dry.  Oropharynx non-erythematous. Neck: No stridor.   Cardiovascular: Normal rate, regular rhythm. Grossly normal heart sounds.  Good peripheral circulation. Respiratory: Normal respiratory effort.  No retractions. Lungs CTAB. Gastrointestinal: Soft and nontender. No distention. No abdominal bruits. No CVA tenderness. Musculoskeletal: No lower extremity tenderness nor edema.  No joint effusions. Moves all extremities well 5 out of 5 to commands. Neurologic:  Normal speech  and language. No gross focal neurologic deficits are appreciated. No facial droop. She smiles, is pleasant and conversant. Skin:  Skin is warm, dry and intact. No rash noted. Psychiatric: Mood and affect are normal. Speech and behavior are normal.  ____________________________________________   LABS (all labs ordered are listed, but only abnormal results are displayed)  Labs Reviewed  BASIC METABOLIC PANEL - Abnormal; Notable for the following:    Glucose, Bld 103 (*)    BUN 53 (*)    Creatinine, Ser 1.28 (*)    GFR calc non Af Amer 36 (*)    GFR calc Af Amer 41 (*)    All other components within normal limits  CBC - Abnormal; Notable for the following:    RBC 3.64 (*)    Hemoglobin 10.8 (*)    HCT 32.1 (*)    All other components within normal limits  URINALYSIS COMPLETEWITH MICROSCOPIC ([facility] ONLY)   ____________________________________________  EKG  ED ECG REPORT I, Careen Mauch, the attending physician, personally viewed and interpreted this ECG.  Date: 01/29/2016 EKG Time: 2200 Rhythm: normal sinus rhythm QRS Axis: normal Intervals: normal ST/T Wave abnormalities: normal Conduction Disturbances: none Narrative Interpretation: unremarkable  ____________________________________________  RADIOLOGY  Discussed risks and benefits of CT of the head with the patient's healthcare power of attorney including that CT could help [place] evaluate for a possible "stroke" or brain bleed" or other cause of  _ exposure healthcare power of attorney advises that they wished to minimize testing and that even if the patient had something going wrong they would not wish to change her current treatment plan and she would never authorize her to have any type of brain surgery. Based on this and shared medical decision making I answered questions related to CT of the head and benefits and we will not perform CT head at the time. Based on the goals of care as discussed with her healthcare power of  attorney I think this is consistent and appropriate.___________________________________________   PROCEDURES  Procedure(s) performed: None  Critical Care performed: No  ____________________________________________   INITIAL IMPRESSION / ASSESSMENT AND PLAN / ED COURSE  Pertinent labs & imaging results that were available during my care of the patient were reviewed by me and considered in my medical decision making (see chart for details).  The patient reportedly had an episode of near-syncope, or severe weakness and slumping forward that lasted 3-5 minutes. This  has resolved and she is at her baseline mental status. Laboratory evaluation appears stable, CT of the head was considered but not desire by healthcare power of attorney and I think this is reasonable even the patient's care goals and discussion with her power of attorney.  Attempted in and out catheter, but patient was uncomfortable with that and thus stopped. After further discussion healthcare power of attorney requests that the goal of stays that since the patient seems to be her regular status now to get her back to her care facility and not perform any further testing but she has a history of urinary tract infection and given this I discussed with and we will treat her empirically for possible urinary tract infection. I did offer further evaluation including admission for observation, CT of the head, and further testing but it became clear discussing with power of attorney that the goal of care is comfort primarily, and the patient will be returned to her care facility without further intervention or admission. ____________________________________________   FINAL CLINICAL IMPRESSION(S) / ED DIAGNOSES  Final diagnoses:  Weakness generalized  Near syncope      Delman Kitten, MD 01/30/16 [phone]

## 2016-01-30 NOTE — Discharge Instructions (Signed)
It is unclear, but we will treat for possible urinary tract infection and if hydrated Hayley Campbell well.  We discussed admission, based on our discussion and we will not admit her but rather send her back to her care facility consistent with her wishes as well as those of her health care power of attorney.  Please bring patient back if her symptoms are worsening, she has another episode where she becomes near unresponsive, she has fever, vomiting, increased confusion or new concerns arise.  Weakness Weakness is a lack of strength. It may be felt all over the body (generalized) or in one specific part of the body (focal). Some causes of weakness can be serious. You may need further medical evaluation, especially if you are elderly or you have a history of immunosuppression (such as chemotherapy or HIV), kidney disease, heart disease, or diabetes. CAUSES  Weakness can be caused by many different things, including:  Infection.  Physical exhaustion.  Internal bleeding or other blood loss that results in a lack of red blood cells (anemia).  Dehydration. This cause is more common in elderly people.  Side effects or electrolyte abnormalities from medicines, such as pain medicines or sedatives.  Emotional distress, anxiety, or depression.  Circulation problems, especially severe peripheral arterial disease.  Heart disease, such as rapid atrial fibrillation, bradycardia, or heart failure.  Nervous system disorders, such as Guillain-Barr syndrome, multiple sclerosis, or stroke. DIAGNOSIS  To find the cause of your weakness, your caregiver will take your history and perform a physical exam. Lab tests or X-rays may also be ordered, if needed. TREATMENT  Treatment of weakness depends on the cause of your symptoms and can vary greatly. HOME CARE INSTRUCTIONS   Rest as needed.  Eat a well-balanced diet.  Try to get some exercise every day.  Only take over-the-counter or prescription  medicines as directed by your caregiver. SEEK MEDICAL CARE IF:   Your weakness seems to be getting worse or spreads to other parts of your body.  You develop new aches or pains. SEEK IMMEDIATE MEDICAL CARE IF:   You cannot perform your normal daily activities, such as getting dressed and feeding yourself.  You cannot walk up and down stairs, or you feel exhausted when you do so.  You have shortness of breath or chest pain.  You have difficulty moving parts of your body.  You have weakness in only one area of the body or on only one side of the body.  You have a fever.  You have trouble speaking or swallowing.  You cannot control your bladder or bowel movements.  You have black or bloody vomit or stools. MAKE SURE YOU:  Understand these instructions.  Will watch your condition.  Will get help right away if you are not doing well or get worse.   This information is not intended to replace advice given to you by your health care provider. Make sure you discuss any questions you have with your health care provider.   Document Released: 08/22/2005 Document Revised: 02/21/2012 Document Reviewed: 10/21/2011 Elsevier Interactive Patient Education [employer].

## 2016-01-30 NOTE — ED Notes (Signed)
2 attempts (Tammy, EDT and Samantha, EDT) to I&O cath pt for urine sample unsuccessful; Dr Jacqualine Code made aware.

## 2016-02-05 ENCOUNTER — Emergency Department
Admission: EM | Admit: 2016-02-05 | Discharge: 2016-02-05 | Disposition: A | Discharge status: Home / Self Care | Payer: Medicare Other | Attending: Emergency Medicine | Admitting: Emergency Medicine

## 2016-02-05 ENCOUNTER — Encounter: Payer: Self-pay | Admitting: Emergency Medicine

## 2016-02-05 ENCOUNTER — Emergency Department: Payer: Medicare Other

## 2016-02-05 DIAGNOSIS — Z853 Personal history of malignant neoplasm of breast: Secondary | ICD-10-CM | POA: Insufficient documentation

## 2016-02-05 DIAGNOSIS — F0390 Unspecified dementia without behavioral disturbance: Secondary | ICD-10-CM | POA: Diagnosis not present

## 2016-02-05 DIAGNOSIS — Z792 Long term (current) use of antibiotics: Secondary | ICD-10-CM | POA: Diagnosis not present

## 2016-02-05 DIAGNOSIS — Z79899 Other long term (current) drug therapy: Secondary | ICD-10-CM | POA: Diagnosis not present

## 2016-02-05 DIAGNOSIS — R1013 Epigastric pain: Secondary | ICD-10-CM

## 2016-02-05 DIAGNOSIS — K219 Gastro-esophageal reflux disease without esophagitis: Secondary | ICD-10-CM | POA: Insufficient documentation

## 2016-02-05 LAB — CBC
HCT: 35.3 % (ref 35.0–47.0)
HEMOGLOBIN: 11.5 g/dL — AB (ref 12.0–16.0)
MCH: 29.2 pg (ref 26.0–34.0)
MCHC: 32.7 g/dL (ref 32.0–36.0)
MCV: 89.5 fL (ref 80.0–100.0)
PLATELETS: 236 10*3/uL (ref 150–440)
RBC: 3.94 MIL/uL (ref 3.80–5.20)
RDW: 13.8 % (ref 11.5–14.5)
WBC: 6.9 10*3/uL (ref 3.6–11.0)

## 2016-02-05 LAB — COMPREHENSIVE METABOLIC PANEL
ALK PHOS: 90 U/L (ref 38–126)
ALT: 20 U/L (ref 14–54)
ANION GAP: 8 (ref 5–15)
AST: 28 U/L (ref 15–41)
Albumin: 4.5 g/dL (ref 3.5–5.0)
BILIRUBIN TOTAL: 0.6 mg/dL (ref 0.3–1.2)
BUN: 55 mg/dL — ABNORMAL HIGH (ref 6–20)
CALCIUM: 10.1 mg/dL (ref 8.9–10.3)
CO2: 26 mmol/L (ref 22–32)
Chloride: 107 mmol/L (ref 101–111)
Creatinine, Ser: 1.56 mg/dL — ABNORMAL HIGH (ref 0.44–1.00)
GFR calc non Af Amer: 28 mL/min — ABNORMAL LOW (ref >60)
GFR, EST AFRICAN AMERICAN: 33 mL/min — AB (ref >60)
Glucose, Bld: 92 mg/dL (ref 65–99)
Potassium: 5 mmol/L (ref 3.5–5.1)
Sodium: 141 mmol/L (ref 135–145)
TOTAL PROTEIN: 7.7 g/dL (ref 6.5–8.1)

## 2016-02-05 LAB — URINALYSIS COMPLETE WITH MICROSCOPIC (ARMC ONLY)
BILIRUBIN URINE: NEGATIVE
Bacteria, UA: NONE SEEN
GLUCOSE, UA: 50 mg/dL — AB
Ketones, ur: NEGATIVE mg/dL
LEUKOCYTES UA: NEGATIVE
NITRITE: NEGATIVE
Protein, ur: NEGATIVE mg/dL
SQUAMOUS EPITHELIAL / LPF: NONE SEEN
Specific Gravity, Urine: 1.016 (ref 1.005–1.030)
pH: 5 (ref 5.0–8.0)

## 2016-02-05 LAB — PROTIME-INR
INR: 0.93
PROTHROMBIN TIME: 12.7 s (ref 11.4–15.0)

## 2016-02-05 LAB — LIPASE, BLOOD: Lipase: 68 U/L — ABNORMAL HIGH (ref 11–51)

## 2016-02-05 MED ORDER — DIATRIZOATE MEGLUMINE & SODIUM 66-10 % PO SOLN
15.0000 mL | ORAL | Status: AC
  Administered 2016-02-05: 15 mL via ORAL

## 2016-02-05 MED ORDER — FAMOTIDINE 20 MG PO TABS
40.0000 mg | ORAL_TABLET | Freq: Once | ORAL | Status: AC
  Administered 2016-02-05: 40 mg via ORAL
  Filled 2016-02-05: qty 2

## 2016-02-05 MED ORDER — ALUM & MAG HYDROXIDE-SIMETH 200-200-20 MG/5ML PO SUSP
30.0000 mL | Freq: Once | ORAL | Status: AC
  Administered 2016-02-05: 30 mL via ORAL
  Filled 2016-02-05: qty 30

## 2016-02-05 NOTE — ED Provider Notes (Signed)
[facility] Emergency Department Provider Note  ____________________________________________  Time seen: 6:30 PM  I have reviewed the triage vital signs and the nursing notes.   HISTORY  Chief Complaint Altered Mental Status  Level 5 caveat:  Portions of the history and physical were unable to be obtained due to the patient's chronic dementia   HPI Hayley Campbell is a 82 y.o. female sent to the ED from [place] due to difficulty walking. Patient reports having epigastric pain earlier today but none now in the emergency department. She denies any weakness headaches or vision changes. No falls or trauma. Eating and drinking normally.     Past Medical History  Diagnosis Date  . Cancer [facility]) 2014    left breast cancer  . Dementia      There are no active problems to display for this patient.    Past Surgical History  Procedure Laterality Date  . None       Current Outpatient Rx  Name  Route  Sig  Dispense  Refill  . Cholecalciferol (VITAMIN D3) 50000 units CAPS   Oral   Take 50,000 Units by mouth every 30 (thirty) days. On the 14th of every month         . fosfomycin (MONUROL) 3 g PACK   Oral   Take 3 g by mouth once. Please mix in 8 oz of water, take by mouth once   3 g   0   . letrozole (FEMARA) 2.5 MG tablet   Oral   Take 2.5 mg by mouth daily.         Marland Kitchen lisinopril (PRINIVIL,ZESTRIL) 40 MG tablet   Oral   Take 40 mg by mouth daily.         Marland Kitchen omeprazole (PRILOSEC) 20 MG capsule   Oral   Take 20 mg by mouth daily. Take one hour before breakfast         . spironolactone (ALDACTONE) 25 MG tablet   Oral   Take 25 mg by mouth daily.         . traZODone (DESYREL) 50 MG tablet   Oral   Take 25 mg by mouth at bedtime.            Allergies Darvon   No family history on file.  Social History Social History  Substance Use Topics  . Smoking status: Never Smoker   . Smokeless tobacco: None  . Alcohol  Use: No    Review of Systems  Constitutional:   No fever or chills.  Eyes:   No vision changes.  ENT:   No sore throat. No rhinorrhea. Cardiovascular:   No chest pain. Respiratory:   No dyspnea or cough. Gastrointestinal:   Positive epigastric pain without vomiting and diarrhea.  Genitourinary:   Negative for dysuria or difficulty urinating. Musculoskeletal:   Negative for focal pain or swelling Neurological:   Negative for headaches 10-point ROS otherwise negative.  ____________________________________________   PHYSICAL EXAM:  VITAL SIGNS: ED Triage Vitals  Enc Vitals Group     BP 02/05/16 1805 153/54 mmHg     Pulse Rate 02/05/16 1805 87     Resp 02/05/16 1805 20     Temp 02/05/16 1805 98.3 F (36.8 C)     Temp Source 02/05/16 1805 Oral     SpO2 02/05/16 1805 97 %     Weight 02/05/16 1805 136 lb (61.689 kg)     Height 02/05/16 1805 5\' 6"  (1.676 m)  Head Cir --      Peak Flow --      Pain Score 02/05/16 1807 0     Pain Loc --      Pain Edu? --      Excl. in [place]? --     Vital signs reviewed, nursing assessments reviewed.   Constitutional:   Alert and oriented. Well appearing and in no distress. Eyes:   No scleral icterus. No conjunctival pallor. PERRL. EOMI.  No nystagmus. ENT   Head:   Normocephalic and atraumatic.   Nose:   No congestion/rhinnorhea. No septal hematoma   Mouth/Throat:   MMM, no pharyngeal erythema. No peritonsillar mass.    Neck:   No stridor. No SubQ emphysema. No meningismus. Hematological/Lymphatic/Immunilogical:   No cervical lymphadenopathy. Cardiovascular:   RRR. Symmetric bilateral radial and DP pulses.  No murmurs.  Respiratory:   Normal respiratory effort without tachypnea nor retractions. Breath sounds are clear and equal bilaterally. No wheezes/rales/rhonchi. Gastrointestinal:   Soft With mild epigastric tenderness. Non distended. There is no CVA tenderness.  No rebound, rigidity, or guarding. Genitourinary:    deferred Musculoskeletal:   Nontender with normal range of motion in all extremities. No joint effusions.  No lower extremity tenderness.  No edema. Neurologic:   Normal speech and language.  CN 2-10 normal. Motor grossly intact. No gross focal neurologic deficits are appreciated.  Skin:    Skin is warm, dry and intact. No rash noted.  No petechiae, purpura, or bullae.  ____________________________________________    LABS (pertinent positives/negatives) (all labs ordered are listed, but only abnormal results are displayed) Labs Reviewed  COMPREHENSIVE METABOLIC PANEL - Abnormal; Notable for the following:    BUN 55 (*)    Creatinine, Ser 1.56 (*)    GFR calc non Af Amer 28 (*)    GFR calc Af Amer 33 (*)    All other components within normal limits  CBC - Abnormal; Notable for the following:    Hemoglobin 11.5 (*)    All other components within normal limits  URINALYSIS COMPLETEWITH MICROSCOPIC ([facility] ONLY) - Abnormal; Notable for the following:    Color, Urine YELLOW (*)    APPearance CLEAR (*)    Glucose, UA 50 (*)    Hgb urine dipstick 2+ (*)    All other components within normal limits  LIPASE, BLOOD - Abnormal; Notable for the following:    Lipase 68 (*)    All other components within normal limits  PROTIME-INR  CBG MONITORING, ED   ____________________________________________   EKG  EKG interpreted by me  Date: 02/05/2016  Rate: 84  Rhythm: normal sinus rhythm  QRS Axis: normal  Intervals: normal  ST/T Wave abnormalities: normal  Conduction Disutrbances: none  Narrative Interpretation: unremarkable      ____________________________________________    RADIOLOGY  CT abdomen and pelvis unremarkable  ____________________________________________   PROCEDURES   ____________________________________________   INITIAL IMPRESSION / ASSESSMENT AND PLAN / ED COURSE  Pertinent labs & imaging results that were available during my care of the patient  were reviewed by me and considered in my medical decision making (see chart for details).  Labs including blood and urine are at baseline, no acute findings. Chronic renal insufficiency, chronic slightly elevated lipase, low suspicion for acute pancreatitis.Considering the patient's symptoms, medical history, and physical examination today, I have low suspicion for cholecystitis or biliary pathology, pancreatitis, perforation or bowel obstruction, hernia, intra-abdominal abscess, AAA or dissection, volvulus or intussusception, mesenteric ischemia, or appendicitis.  After  receiving antacids in the emergency department patient reports she is feeling better. States she feels very well and is happy to go home. We'll discharge home, counseled to continue antacids as needed, follow up with primary care in 2-3 days.     ____________________________________________   FINAL CLINICAL IMPRESSION(S) / ED DIAGNOSES  Final diagnoses:  Epigastric pain  Gastroesophageal reflux disease, esophagitis presence not specified       Portions of this note were generated with dragon dictation software. Dictation errors may occur despite best attempts at proofreading.   Carrie Mew, MD 02/05/16 [id number]

## 2016-02-05 NOTE — ED Notes (Signed)
Patient transported to CT 

## 2016-02-05 NOTE — ED Notes (Signed)
Per EMS, patient comes from [facility]. When EMS was there picking up another resident staff called out that this patient was having a right sided facial droop and a change in mental status. Hx of dementia, only oriented to self. Staff at MR unable to state how she is altered from baseline "She is just more confused". No facial droop noted. Equal grasp noted bilaterally. No arm or leg drift noted. Patient denies pain.

## 2016-02-05 NOTE — Discharge Instructions (Signed)
Abdominal Pain, Adult °Many things can cause abdominal pain. Usually, abdominal pain is not caused by a disease and will improve without treatment. It can often be observed and treated at home. Your health care provider will do a physical exam and possibly order blood tests and X-rays to help determine the seriousness of your pain. However, in many cases, more time must pass before a clear cause of the pain can be found. Before that point, your health care provider may not know if you need more testing or further treatment. °HOME CARE INSTRUCTIONS °Monitor your abdominal pain for any changes. The following actions may help to alleviate any discomfort you are experiencing: °· Only take over-the-counter or prescription medicines as directed by your health care provider. °· Do not take laxatives unless directed to do so by your health care provider. °· Try a clear liquid diet (broth, tea, or water) as directed by your health care provider. Slowly move to a bland diet as tolerated. °SEEK MEDICAL CARE IF: °· You have unexplained abdominal pain. °· You have abdominal pain associated with nausea or diarrhea. °· You have pain when you urinate or have a bowel movement. °· You experience abdominal pain that wakes you in the night. °· You have abdominal pain that is worsened or improved by eating food. °· You have abdominal pain that is worsened with eating fatty foods. °· You have a fever. °SEEK IMMEDIATE MEDICAL CARE IF: °· Your pain does not go away within 2 hours. °· You keep throwing up (vomiting). °· Your pain is felt only in portions of the abdomen, such as the right side or the left lower portion of the abdomen. °· You pass bloody or black tarry stools. °MAKE SURE YOU: °· Understand these instructions. °· Will watch your condition. °· Will get help right away if you are not doing well or get worse. °  °This information is not intended to replace advice given to you by your health care provider. Make sure you discuss  any questions you have with your health care provider. °  °Document Released: 06/01/2005 Document Revised: 05/13/2015 Document Reviewed: 05/01/2013 °Elsevier Interactive Patient Education ©2016 [employer] ° °Gastroesophageal Reflux Disease, Adult °Normally, food travels down the esophagus and stays in the stomach to be digested. However, when a person has gastroesophageal reflux disease (GERD), food and stomach acid move back up into the esophagus. When this happens, the esophagus becomes sore and inflamed. Over time, GERD can create small holes (ulcers) in the lining of the esophagus.  °CAUSES °This condition is caused by a problem with the muscle between the esophagus and the stomach (lower esophageal sphincter, or LES). Normally, the LES muscle closes after food passes through the esophagus to the stomach. When the LES is weakened or abnormal, it does not close properly, and that allows food and stomach acid to go back up into the esophagus. The LES can be weakened by certain dietary substances, medicines, and medical conditions, including: °· Tobacco use. °· Pregnancy. °· Having a hiatal hernia. °· Heavy alcohol use. °· Certain foods and beverages, such as coffee, chocolate, onions, and peppermint. °RISK FACTORS °This condition is more likely to develop in: °· People who have an increased body weight. °· People who have connective tissue disorders. °· People who use NSAID medicines. °SYMPTOMS °Symptoms of this condition include: °· Heartburn. °· Difficult or painful swallowing. °· The feeling of having a lump in the throat. °· A bitter taste in the mouth. °· Bad breath. °· Having   a large amount of saliva. °· Having an upset or bloated stomach. °· Belching. °· Chest pain. °· Shortness of breath or wheezing. °· Ongoing (chronic) cough or a night-time cough. °· Wearing away of tooth enamel. °· Weight loss. °Different conditions can cause chest pain. Make sure to see your health care provider if you experience  chest pain. °DIAGNOSIS °Your health care provider will take a medical history and perform a physical exam. To determine if you have mild or severe GERD, your health care provider may also monitor how you respond to treatment. You may also have other tests, including: °· An endoscopy to examine your stomach and esophagus with a small camera. °· A test that measures the acidity level in your esophagus. °· A test that measures how much pressure is on your esophagus. °· A barium swallow or modified barium swallow to show the shape, size, and functioning of your esophagus. °TREATMENT °The goal of treatment is to help relieve your symptoms and to prevent complications. Treatment for this condition may vary depending on how severe your symptoms are. Your health care provider may recommend: °· Changes to your diet. °· Medicine. °· Surgery. °HOME CARE INSTRUCTIONS °Diet °· Follow a diet as recommended by your health care provider. This may involve avoiding foods and drinks such as: °¨ Coffee and tea (with or without caffeine). °¨ Drinks that contain alcohol. °¨ Energy drinks and sports drinks. °¨ Carbonated drinks or sodas. °¨ Chocolate and cocoa. °¨ Peppermint and mint flavorings. °¨ Garlic and onions. °¨ Horseradish. °¨ Spicy and acidic foods, including peppers, chili powder, curry powder, vinegar, hot sauces, and barbecue sauce. °¨ Citrus fruit juices and citrus fruits, such as oranges, lemons, and limes. °¨ Tomato-based foods, such as red sauce, chili, salsa, and pizza with red sauce. °¨ Fried and fatty foods, such as donuts, french fries, potato chips, and high-fat dressings. °¨ High-fat meats, such as hot dogs and fatty cuts of red and white meats, such as rib eye steak, sausage, ham, and bacon. °¨ High-fat dairy items, such as whole milk, butter, and cream cheese. °· Eat small, frequent meals instead of large meals. °· Avoid drinking large amounts of liquid with your meals. °· Avoid eating meals during the 2-3 hours  before bedtime. °· Avoid lying down right after you eat. °· Do not exercise right after you eat. ° General Instructions  °· Pay attention to any changes in your symptoms. °· Take over-the-counter and prescription medicines only as told by your health care provider. Do not take aspirin, ibuprofen, or other NSAIDs unless your health care provider told you to do so. °· Do not use any tobacco products, including cigarettes, chewing tobacco, and e-cigarettes. If you need help quitting, ask your health care provider. °· Wear loose-fitting clothing. Do not wear anything tight around your waist that causes pressure on your abdomen. °· Raise (elevate) the head of your bed 6 inches (15cm). °· Try to reduce your stress, such as with yoga or meditation. If you need help reducing stress, ask your health care provider. °· If you are overweight, reduce your weight to an amount that is healthy for you. Ask your health care provider for guidance about a safe weight loss goal. °· Keep all follow-up visits as told by your health care provider. This is important. °SEEK MEDICAL CARE IF: °· You have new symptoms. °· You have unexplained weight loss. °· You have difficulty swallowing, or it hurts to swallow. °· You have wheezing or a persistent cough. °· Your symptoms do not   improve with treatment. °· You have a hoarse voice. °SEEK IMMEDIATE MEDICAL CARE IF: °· You have pain in your arms, neck, jaw, teeth, or back. °· You feel sweaty, dizzy, or light-headed. °· You have chest pain or shortness of breath. °· You vomit and your vomit looks like blood or coffee grounds. °· You faint. °· Your stool is bloody or black. °· You cannot swallow, drink, or eat. °  °This information is not intended to replace advice given to you by your health care provider. Make sure you discuss any questions you have with your health care provider. °  °Document Released: 06/01/2005 Document Revised: 05/13/2015 Document Reviewed: 12/17/2014 °Elsevier Interactive  Patient Education ©2016 [employer] Inc. ° °

## 2016-04-16 ENCOUNTER — Emergency Department: Payer: Medicare Other

## 2016-04-16 ENCOUNTER — Observation Stay
Admission: EM | Admit: 2016-04-16 | Discharge: 2016-04-18 | Disposition: A | Discharge status: Skilled Nursing Facility | Payer: Medicare Other | Attending: Internal Medicine | Admitting: Internal Medicine

## 2016-04-16 DIAGNOSIS — K219 Gastro-esophageal reflux disease without esophagitis: Secondary | ICD-10-CM | POA: Diagnosis not present

## 2016-04-16 DIAGNOSIS — N183 Chronic kidney disease, stage 3 unspecified: Secondary | ICD-10-CM | POA: Diagnosis present

## 2016-04-16 DIAGNOSIS — Z853 Personal history of malignant neoplasm of breast: Secondary | ICD-10-CM | POA: Insufficient documentation

## 2016-04-16 DIAGNOSIS — E875 Hyperkalemia: Secondary | ICD-10-CM | POA: Insufficient documentation

## 2016-04-16 DIAGNOSIS — F0390 Unspecified dementia without behavioral disturbance: Secondary | ICD-10-CM | POA: Diagnosis present

## 2016-04-16 DIAGNOSIS — R531 Weakness: Secondary | ICD-10-CM

## 2016-04-16 DIAGNOSIS — C50919 Malignant neoplasm of unspecified site of unspecified female breast: Secondary | ICD-10-CM | POA: Diagnosis present

## 2016-04-16 DIAGNOSIS — X58XXXA Exposure to other specified factors, initial encounter: Secondary | ICD-10-CM | POA: Insufficient documentation

## 2016-04-16 DIAGNOSIS — E785 Hyperlipidemia, unspecified: Secondary | ICD-10-CM | POA: Diagnosis not present

## 2016-04-16 DIAGNOSIS — Z79899 Other long term (current) drug therapy: Secondary | ICD-10-CM | POA: Diagnosis not present

## 2016-04-16 DIAGNOSIS — T68XXXA Hypothermia, initial encounter: Secondary | ICD-10-CM | POA: Diagnosis not present

## 2016-04-16 DIAGNOSIS — Z79811 Long term (current) use of aromatase inhibitors: Secondary | ICD-10-CM | POA: Diagnosis not present

## 2016-04-16 DIAGNOSIS — I129 Hypertensive chronic kidney disease with stage 1 through stage 4 chronic kidney disease, or unspecified chronic kidney disease: Secondary | ICD-10-CM | POA: Insufficient documentation

## 2016-04-16 DIAGNOSIS — E860 Dehydration: Secondary | ICD-10-CM | POA: Insufficient documentation

## 2016-04-16 DIAGNOSIS — M8580 Other specified disorders of bone density and structure, unspecified site: Secondary | ICD-10-CM | POA: Diagnosis not present

## 2016-04-16 DIAGNOSIS — Z66 Do not resuscitate: Secondary | ICD-10-CM | POA: Insufficient documentation

## 2016-04-16 DIAGNOSIS — I700 Atherosclerosis of aorta: Secondary | ICD-10-CM | POA: Insufficient documentation

## 2016-04-16 DIAGNOSIS — I10 Essential (primary) hypertension: Secondary | ICD-10-CM | POA: Diagnosis present

## 2016-04-16 HISTORY — DX: Chronic kidney disease, stage 3 (moderate): N18.3

## 2016-04-16 HISTORY — DX: Essential (primary) hypertension: I10

## 2016-04-16 HISTORY — DX: Hyperlipidemia, unspecified: E78.5

## 2016-04-16 HISTORY — DX: Gastro-esophageal reflux disease without esophagitis: K21.9

## 2016-04-16 HISTORY — DX: Chronic kidney disease, stage 3 unspecified: N18.30

## 2016-04-16 LAB — COMPREHENSIVE METABOLIC PANEL
ALBUMIN: 3.9 g/dL (ref 3.5–5.0)
ALK PHOS: 94 U/L (ref 38–126)
ALT: 32 U/L (ref 14–54)
AST: 35 U/L (ref 15–41)
Anion gap: 5 (ref 5–15)
BILIRUBIN TOTAL: 0.6 mg/dL (ref 0.3–1.2)
BUN: 59 mg/dL — AB (ref 6–20)
CALCIUM: 9.9 mg/dL (ref 8.9–10.3)
CO2: 23 mmol/L (ref 22–32)
CREATININE: 1.3 mg/dL — AB (ref 0.44–1.00)
Chloride: 113 mmol/L — ABNORMAL HIGH (ref 101–111)
GFR calc Af Amer: 41 mL/min — ABNORMAL LOW (ref >60)
GFR calc non Af Amer: 35 mL/min — ABNORMAL LOW (ref >60)
GLUCOSE: 126 mg/dL — AB (ref 65–99)
Potassium: 5.7 mmol/L — ABNORMAL HIGH (ref 3.5–5.1)
SODIUM: 141 mmol/L (ref 135–145)
TOTAL PROTEIN: 7.4 g/dL (ref 6.5–8.1)

## 2016-04-16 LAB — CBC WITH DIFFERENTIAL/PLATELET
BASOS PCT: 0 %
Basophils Absolute: 0 10*3/uL (ref 0–0.1)
Eosinophils Absolute: 0 10*3/uL (ref 0–0.7)
Eosinophils Relative: 1 %
HEMATOCRIT: 35.6 % (ref 35.0–47.0)
Hemoglobin: 11.8 g/dL — ABNORMAL LOW (ref 12.0–16.0)
LYMPHS ABS: 0.7 10*3/uL — AB (ref 1.0–3.6)
Lymphocytes Relative: 16 %
MCH: 29.8 pg (ref 26.0–34.0)
MCHC: 33.1 g/dL (ref 32.0–36.0)
MCV: 90.1 fL (ref 80.0–100.0)
MONO ABS: 0.3 10*3/uL (ref 0.2–0.9)
MONOS PCT: 6 %
NEUTROS ABS: 3.2 10*3/uL (ref 1.4–6.5)
Neutrophils Relative %: 77 %
Platelets: 218 10*3/uL (ref 150–440)
RBC: 3.95 MIL/uL (ref 3.80–5.20)
RDW: 13.9 % (ref 11.5–14.5)
WBC: 4.2 10*3/uL (ref 3.6–11.0)

## 2016-04-16 LAB — URINALYSIS COMPLETE WITH MICROSCOPIC (ARMC ONLY)
BACTERIA UA: NONE SEEN
BILIRUBIN URINE: NEGATIVE
GLUCOSE, UA: 50 mg/dL — AB
HGB URINE DIPSTICK: NEGATIVE
Ketones, ur: NEGATIVE mg/dL
Leukocytes, UA: NEGATIVE
NITRITE: NEGATIVE
Protein, ur: NEGATIVE mg/dL
SQUAMOUS EPITHELIAL / LPF: NONE SEEN
Specific Gravity, Urine: 1.016 (ref 1.005–1.030)
WBC UA: NONE SEEN WBC/hpf (ref 0–5)
pH: 5 (ref 5.0–8.0)

## 2016-04-16 LAB — T4, FREE: FREE T4: 1.1 ng/dL (ref 0.61–1.12)

## 2016-04-16 LAB — TSH: TSH: 1.652 u[IU]/mL (ref 0.350–4.500)

## 2016-04-16 LAB — TROPONIN I: Troponin I: <0.03 ng/mL (ref <0.03)

## 2016-04-16 MED ORDER — ACETAMINOPHEN 650 MG RE SUPP: 650.0000 mg | Freq: Four times a day (QID) | RECTAL | Status: DC | PRN

## 2016-04-16 MED ORDER — ENOXAPARIN SODIUM 40 MG/0.4ML ~~LOC~~ SOLN: 40.0000 mg | SUBCUTANEOUS | Status: DC

## 2016-04-16 MED ORDER — SODIUM POLYSTYRENE SULFONATE 15 GM/60ML PO SUSP
15.0000 g | Freq: Once | ORAL | Status: AC
  Administered 2016-04-16: 15 g via ORAL
  Filled 2016-04-16: qty 60

## 2016-04-16 MED ORDER — FAMOTIDINE 20 MG PO TABS
10.0000 mg | ORAL_TABLET | Freq: Two times a day (BID) | ORAL | Status: DC
  Administered 2016-04-16 – 2016-04-18 (×4): 10 mg via ORAL
  Filled 2016-04-16: qty 1
  Filled 2016-04-16: qty 2
  Filled 2016-04-16 (×2): qty 1

## 2016-04-16 MED ORDER — SODIUM CHLORIDE 0.9 % IV BOLUS (SEPSIS)
1000.0000 mL | Freq: Once | INTRAVENOUS | Status: AC
  Administered 2016-04-16: 1000 mL via INTRAVENOUS

## 2016-04-16 MED ORDER — ACETAMINOPHEN 325 MG PO TABS: 650.0000 mg | ORAL_TABLET | Freq: Four times a day (QID) | ORAL | Status: DC | PRN

## 2016-04-16 MED ORDER — SPIRONOLACTONE 25 MG PO TABS: 25.0000 mg | ORAL_TABLET | Freq: Every day | ORAL | Status: DC

## 2016-04-16 MED ORDER — ONDANSETRON HCL 4 MG PO TABS: 4.0000 mg | ORAL_TABLET | Freq: Four times a day (QID) | ORAL | Status: DC | PRN

## 2016-04-16 MED ORDER — ONDANSETRON HCL 4 MG/2ML IJ SOLN: 4.0000 mg | Freq: Four times a day (QID) | INTRAMUSCULAR | Status: DC | PRN

## 2016-04-16 MED ORDER — LETROZOLE 2.5 MG PO TABS
2.5000 mg | ORAL_TABLET | Freq: Every day | ORAL | Status: DC
  Administered 2016-04-17 – 2016-04-18 (×2): 2.5 mg via ORAL
  Filled 2016-04-16 (×2): qty 1

## 2016-04-16 MED ORDER — ENOXAPARIN SODIUM 30 MG/0.3ML ~~LOC~~ SOLN: 30.0000 mg | SUBCUTANEOUS | Status: DC

## 2016-04-16 MED ORDER — TRAZODONE HCL 50 MG PO TABS
25.0000 mg | ORAL_TABLET | Freq: Every day | ORAL | Status: DC
  Administered 2016-04-16 – 2016-04-17 (×2): 25 mg via ORAL
  Filled 2016-04-16 (×2): qty 1

## 2016-04-16 MED ORDER — SODIUM CHLORIDE 0.9% FLUSH
3.0000 mL | Freq: Two times a day (BID) | INTRAVENOUS | Status: DC
  Administered 2016-04-16 – 2016-04-17 (×2): 3 mL via INTRAVENOUS

## 2016-04-16 MED ORDER — LISINOPRIL 20 MG PO TABS
40.0000 mg | ORAL_TABLET | Freq: Every day | ORAL | Status: DC
  Administered 2016-04-17 – 2016-04-18 (×2): 40 mg via ORAL
  Filled 2016-04-16 (×2): qty 2

## 2016-04-16 NOTE — H&P (Signed)
[facility] [place] NAME: Hayley Campbell    MR#:  [id number]  DATE OF BIRTH:  08/31/1923  DATE OF ADMISSION:  04/16/2016  PRIMARY CARE PHYSICIAN: No PCP Per Patient   REQUESTING/REFERRING PHYSICIAN: Edd Fabian, MD  CHIEF COMPLAINT:   Chief Complaint  Patient presents with  . Other    HISTORY OF PRESENT ILLNESS:  Hayley Campbell  is a 82 y.o. female who presents with Hypothermia. Patient is demented at baseline and unable to contribute to her history. History is taken from collateral information from the nursing facility where she resides. She presented to the ED today hypothermic. She has no other significant abnormality and workup. No signs or symptoms of infection, but her temperature is extremely low. Bare hugger was initiated in the ED and hospitalist called for observation.  PAST MEDICAL HISTORY:   Past Medical History:  Diagnosis Date  . Cancer [facility]) 2014   left breast cancer  . CKD (chronic kidney disease), stage III   . Dementia   . GERD (gastroesophageal reflux disease)   . HLD (hyperlipidemia)   . HTN (hypertension)     PAST SURGICAL HISTORY:   Past Surgical History:  Procedure Laterality Date  . none      SOCIAL HISTORY:   Social History  Substance Use Topics  . Smoking status: Never Smoker  . Smokeless tobacco: Never Used  . Alcohol use No    FAMILY HISTORY:   Family History  Problem Relation Age of Onset  . Family history unknown: Yes   Patient unable to give family history due to dementia DRUG ALLERGIES:   Allergies  Allergen Reactions  . Darvon [Propoxyphene] Other (See Comments)    Reaction: unknown    MEDICATIONS AT HOME:   Prior to Admission medications   Medication Sig Start Date End Date Taking? Authorizing Provider  Cholecalciferol (VITAMIN D3) 50000 units CAPS Take 50,000 Units by mouth every 30 (thirty) days. On the 14th of every month   Yes Historical Provider, MD   letrozole (FEMARA) 2.5 MG tablet Take 2.5 mg by mouth daily.   Yes Historical Provider, MD  lisinopril (PRINIVIL,ZESTRIL) 40 MG tablet Take 40 mg by mouth daily.   Yes Historical Provider, MD  ranitidine (ZANTAC) 75 MG tablet Take 75 mg by mouth 2 (two) times daily.   Yes Historical Provider, MD  spironolactone (ALDACTONE) 25 MG tablet Take 25 mg by mouth daily.   Yes Historical Provider, MD  traZODone (DESYREL) 50 MG tablet Take 25 mg by mouth at bedtime.   Yes Historical Provider, MD  fosfomycin (MONUROL) 3 g PACK Take 3 g by mouth once. Please mix in 8 oz of water, take by mouth once Patient not taking: Reported on 04/16/2016 02/02/16   Delman Kitten, MD    REVIEW OF SYSTEMS:  Review of Systems  Unable to perform ROS: Dementia     VITAL SIGNS:   Vitals:   04/16/16 2000 04/16/16 2115 04/16/16 2136 04/16/16 2137  BP: (!) 132/96  (!) 139/51 (!) 139/51  Pulse: 64  73 75  Resp: 20  18 19   Temp:  (!) 91.9 F (33.3 C) (!) 90.5 F (32.5 C) (!) 90.6 F (32.6 C)  TempSrc:  Rectal  Rectal  SpO2: 100%  99% 99%  Weight:      Height:       Wt Readings from Last 3 Encounters:  04/16/16 59 kg (130 lb)  02/05/16 61.7 kg (136 lb)  01/29/16  62.9 kg (138 lb 11.2 oz)    PHYSICAL EXAMINATION:  Physical Exam  Vitals reviewed. Constitutional: She appears well-developed and well-nourished. No distress.  HENT:  Head: Normocephalic and atraumatic.  Mouth/Throat: Oropharynx is clear and moist.  Eyes: Conjunctivae and EOM are normal. Pupils are equal, round, and reactive to light. No scleral icterus.  Neck: Normal range of motion. Neck supple. No JVD present. No thyromegaly present.  Cardiovascular: Normal rate, regular rhythm and intact distal pulses.  Exam reveals no gallop and no friction rub.   No murmur heard. Respiratory: Effort normal and breath sounds normal. No respiratory distress. She has no wheezes. She has no rales.  GI: Soft. Bowel sounds are normal. She exhibits no distension. There  is no tenderness.  Musculoskeletal: Normal range of motion. She exhibits no edema.  No arthritis, no gout  Lymphadenopathy:    She has no cervical adenopathy.  Neurological: She is alert. No cranial nerve deficit.  No dysarthria, no aphasia.  Unable to completely assess as the patient only follows some commands, though no focal deficit detectable.  Skin: Skin is dry. No rash noted. No erythema.  Skin is cool to touch  Psychiatric:  Unable to assess due to dementia    LABORATORY PANEL:   CBC  Recent Labs Lab 04/16/16 1955  WBC 4.2  HGB 11.8*  HCT 35.6  PLT 218   ------------------------------------------------------------------------------------------------------------------  Chemistries   Recent Labs Lab 04/16/16 1955  NA 141  K 5.7*  CL 113*  CO2 23  GLUCOSE 126*  BUN 59*  CREATININE 1.30*  CALCIUM 9.9  AST 35  ALT 32  ALKPHOS 94  BILITOT 0.6   ------------------------------------------------------------------------------------------------------------------  Cardiac Enzymes  Recent Labs Lab 04/16/16 1955  TROPONINI <0.03   ------------------------------------------------------------------------------------------------------------------  RADIOLOGY:  Dg Chest 2 View  Result Date: 04/16/2016 CLINICAL DATA:  82 year old female with weakness. Initial encounter. EXAM: CHEST  2 VIEW COMPARISON:  11/26/2015 and earlier. FINDINGS: Seated AP and lateral views of the chest mediastinal contours are normal aside from mild tortuosity of the aorta. Calcified aortic atherosclerosis. Incidental azygos fissure on the right. Visualized tracheal air column is within normal limits. No pneumothorax, pulmonary edema, pleural effusion or confluent pulmonary opacity. Diffuse osteopenia. No acute osseous abnormality identified. IMPRESSION: No acute cardiopulmonary abnormality. Calcified aortic atherosclerosis. Electronically Signed   By: Genevie Ann M.D.   On: 04/16/2016 20:35   Ct  Head Wo Contrast  Result Date: 04/16/2016 CLINICAL DATA:  82 year old female with weakness. Dementia. Initial encounter. EXAM: CT HEAD WITHOUT CONTRAST TECHNIQUE: Contiguous axial images were obtained from the base of the skull through the vertex without intravenous contrast. COMPARISON:  Head CT without contrast 12/22/2010 and earlier. FINDINGS: Visualized paranasal sinuses and mastoids are stable and well pneumatized. Negative orbit and scalp soft tissues. Calcified atherosclerosis at the skull base. No acute osseous abnormality identified. Cerebral volume is not significantly changed since 2012. No midline shift, mass effect, or evidence of intracranial mass lesion. No ventriculomegaly. Mildly asymmetric extra-axial CSF about the cerebral hemispheres and in the left posterior fossa is stable. No acute intracranial hemorrhage identified. Mild progression of patchy bilateral cerebral white matter and basal ganglia hypodensity which appears fairly symmetric. No cortically based acute infarct identified. No suspicious intracranial vascular hyperdensity. IMPRESSION: 1.  No acute intracranial abnormality. 2. Mild progression since 2012 of white matter and basal ganglia changes compatible with chronic small vessel disease. Electronically Signed   By: Genevie Ann M.D.   On: 04/16/2016 20:52  EKG:   Orders placed or performed during the hospital encounter of 04/16/16  . ED EKG  . ED EKG  . EKG 12-Lead  . EKG 12-Lead  . EKG 12-Lead  . EKG 12-Lead  . EKG 12-Lead  . EKG 12-Lead    IMPRESSION AND PLAN:  Principal Problem:   Hypothermia - unclear etiology for her hypothermia. She is currently on a warming blanket. We have sent blood and urine cultures, despite the fact that she has no leukocytosis and her UA does not look infected. Active Problems:   HTN (hypertension) - stable, continue home meds   Breast cancer ([place]) - continue home meds, patient has a history of the same, is currently still on  maintenance letrozole   Dementia - continue home meds   GERD (gastroesophageal reflux disease) - home dose H2 blocker   CKD (chronic kidney disease), stage III - at baseline, avoid nephrotoxins and monitor  All the records are reviewed and case discussed with ED provider. Management plans discussed with the patient and/or family.  DVT PROPHYLAXIS: SubQ lovenox  GI PROPHYLAXIS: H2 blocker  ADMISSION STATUS: Observation  CODE STATUS: DO NOT RESUSCITATE Code Status History    This patient does not have a recorded code status. Please follow your organizational policy for patients in this situation.    Advance Directive Documentation   Flowsheet Row Most Recent Value  Type of Advance Directive  Out of facility DNR (pink MOST or yellow form)  Pre-existing out of facility DNR order (yellow form or pink MOST form)  Yellow form placed in chart (order not valid for inpatient use)  "MOST" Form in Place?  No data      TOTAL TIME TAKING CARE OF THIS PATIENT: 40 minutes.    Camri Molloy Williamson 04/16/2016, 9:49 PM  [facility]  Office  [phone]  CC: Primary care physician; No PCP Per Patient

## 2016-04-16 NOTE — ED Notes (Signed)
Pt transported to room 108 

## 2016-04-16 NOTE — Care Management Obs Status (Signed)
[facility] NOTIFICATION   Patient Details  Name: Hayley Campbell MRN: [id number] Date of Birth: 1922/09/18   Medicare Observation Status Notification Given: No pt is Demented      Bradly Sangiovanni, Antony Haste, RN 04/16/2016, 9:53 PM

## 2016-04-16 NOTE — ED Provider Notes (Signed)
[facility] Emergency Department Provider Note   ____________________________________________   First MD Initiated Contact with Patient 04/16/16 1955     (approximate)  I have reviewed the triage vital signs and the nursing notes.   HISTORY  Chief Complaint Other  Caveat-history of present illness and review of systems Limited due to the patient's dementia. All information is obtained from staff at [facility] as well as EMS on arrival.  HPI Hayley Campbell is a 82 y.o. female with history of dementia, CAD stage III, history of breast cancer, hypertension, hyperlipidemia, GERD who presents for evaluation of generalized weakness today, poor appetite, gradual onset, first noted at 3 PM. According to her caregivers that that in ribs, she typically walks independently and leans a little bit to the right however when she was trying to walk today, she was leaning straight back and was requiring a significant amount of assistance which she doesn't usually require. She has had poor appetite this evening. No vomiting or diarrhea, no fevers or chills.   Past Medical History:  Diagnosis Date  . Cancer [facility]) 2014   left breast cancer  . CKD (chronic kidney disease), stage III   . Dementia   . GERD (gastroesophageal reflux disease)   . HLD (hyperlipidemia)   . HTN (hypertension)     There are no active problems to display for this patient.   Past Surgical History:  Procedure Laterality Date  . none      Prior to Admission medications   Medication Sig Start Date End Date Taking? Authorizing Provider  Cholecalciferol (VITAMIN D3) 50000 units CAPS Take 50,000 Units by mouth every 30 (thirty) days. On the 14th of every month   Yes Historical Provider, MD  letrozole (FEMARA) 2.5 MG tablet Take 2.5 mg by mouth daily.   Yes Historical Provider, MD  lisinopril (PRINIVIL,ZESTRIL) 40 MG tablet Take 40 mg by mouth daily.   Yes Historical Provider, MD  ranitidine  (ZANTAC) 75 MG tablet Take 75 mg by mouth 2 (two) times daily.   Yes Historical Provider, MD  spironolactone (ALDACTONE) 25 MG tablet Take 25 mg by mouth daily.   Yes Historical Provider, MD  traZODone (DESYREL) 50 MG tablet Take 25 mg by mouth at bedtime.   Yes Historical Provider, MD  fosfomycin (MONUROL) 3 g PACK Take 3 g by mouth once. Please mix in 8 oz of water, take by mouth once Patient not taking: Reported on 04/16/2016 02/02/16   Delman Kitten, MD    Allergies Darvon [propoxyphene]  Family History  Problem Relation Age of Onset  . Family history unknown: Yes    Social History Social History  Substance Use Topics  . Smoking status: Never Smoker  . Smokeless tobacco: Never Used  . Alcohol use No    Review of Systems  Caveat-history of present illness and review of systems Limited due to the patient's dementia. All information is obtained from staff at [facility] as well as EMS on arrival. ____________________________________________   PHYSICAL EXAM:    Vitals:   04/16/16 2000 04/16/16 2115 04/16/16 2136 04/16/16 2137  BP: (!) 132/96  (!) 139/51 (!) 139/51  Pulse: 64  73 75  Resp: 20  18 19   Temp:  (!) 91.9 F (33.3 C) (!) 90.5 F (32.5 C) (!) 90.6 F (32.6 C)  TempSrc:  Rectal  Rectal  SpO2: 100%  99% 99%  Weight:      Height:        VITAL SIGNS:  ED Triage Vitals [04/16/16 1953]  Enc Vitals Group     BP 138/79     Pulse Rate 62     Resp      Temp      Temp src      SpO2      Weight      Height      Head Circumference      Peak Flow      Pain Score      Pain Loc      Pain Edu?      Excl. in [place]?     Constitutional: Alert and oriented to self only, pleasantly demented. 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 Nontoxic appearing and in no acute distress. Eyes: Conjunctivae are normal. PERRL. EOMI. Head: Atraumatic. Nose: No congestion/rhinnorhea. Mouth/Throat: Mucous  membranes are dry.  Oropharynx non-erythematous. Neck: No stridor.  Supple without meningismus. Cardiovascular: Normal rate, regular rhythm. Grossly normal heart sounds.  Good peripheral circulation. Respiratory: Normal respiratory effort.  No retractions. Lungs CTAB. Gastrointestinal: Soft and nontender. No distention.  No CVA tenderness. Genitourinary: deferred Musculoskeletal: No lower extremity tenderness nor edema.  No joint effusions. Neurologic:  Normal speech and language. Follow commands to move all extremities which she appears to do spontaneously and equally. Skin:  Skin is warm, dry and intact. No rash noted. Psychiatric: Mood and affect are normal. Speech and behavior are normal.  ____________________________________________   LABS (all labs ordered are listed, but only abnormal results are displayed)  Labs Reviewed  CBC WITH DIFFERENTIAL/PLATELET - Abnormal; Notable for the following:       Result Value   Hemoglobin 11.8 (*)    Lymphs Abs 0.7 (*)    All other components within normal limits  COMPREHENSIVE METABOLIC PANEL - Abnormal; Notable for the following:    Potassium 5.7 (*)    Chloride 113 (*)    Glucose, Bld 126 (*)    BUN 59 (*)    Creatinine, Ser 1.30 (*)    GFR calc non Af Amer 35 (*)    GFR calc Af Amer 41 (*)    All other components within normal limits  URINALYSIS COMPLETEWITH MICROSCOPIC ([facility] ONLY) - Abnormal; Notable for the following:    Color, Urine YELLOW (*)    APPearance CLEAR (*)    Glucose, UA 50 (*)    All other components within normal limits  TROPONIN I  TSH  T4, FREE   ____________________________________________  EKG  ED ECG REPORT I, Joanne Gavel, the attending physician, personally viewed and interpreted this ECG.   Date: 04/16/2016  EKG Time: 20:02  Rate: 65  Rhythm: normal sinus rhythm  Axis: normal  Intervals:none  ST&T Change: No acute escalation MI. No acute ST depression. Motion  artifact.  ____________________________________________  RADIOLOGY  CT head IMPRESSION: 1. No acute intracranial abnormality. 2. Mild progression since 2012 of white matter and basal ganglia changes compatible with chronic small vessel disease.  CXR IMPRESSION: No acute cardiopulmonary abnormality.  Calcified aortic atherosclerosis. ____________________________________________   PROCEDURES  Procedure(s) performed: None  Procedures  Critical Care performed: Yes, see critical care note(s).  CRITICAL CARE Performed by: Loura Pardon A   Total critical care time: 30 minutes  Critical care time was exclusive of separately billable procedures and treating other patients.  Critical care was necessary to treat or prevent imminent or life-threatening deterioration.  Critical care was time spent personally  by me on the following activities: development of treatment plan with patient and/or surrogate as well as nursing, discussions with consultants, evaluation of patient's response to treatment, examination of patient, obtaining history from patient or surrogate, ordering and performing treatments and interventions, ordering and review of laboratory studies, ordering and review of radiographic studies, pulse oximetry and re-evaluation of patient's condition.  ____________________________________________   INITIAL IMPRESSION / ASSESSMENT AND PLAN / ED COURSE  Pertinent labs & imaging results that were available during my care of the patient were reviewed by me and considered in my medical decision making (see chart for details).  Hayley Campbell is a 82 y.o. female with history of dementia, CAD stage III, history of breast cancer, hypertension, hyperlipidemia, GERD who presents for evaluation of generalized weakness today, poor appetite. On exam, she is pleasantly demented, in no acute distress, follows commands to move all extremities. She is severely hypothermic with a  temperature of 90.6 F, bair hugger placed, rectal probe in place for continuous temperature measurement. The cause of her hypothermia is not clear to me. She does not have an obvious infectious source, normal white count. I did add a TSH and a T4 which are pending. I reviewed her labs. CBC shows a mild anemia, CMP with mild creatinine elevation and hyperkalemia, potassium 5.7 but no acute hyperkalemic EKG change., she is receiving IV fluids. Negative troponin. Urinalysis not consistent with infection. Chest x-ray shows no acute cardiopulmonary abnormality. CT head negative for any acute intracranial process. Attempted to reach her healthcare power of attorney by phone but I was unsuccessful. I discussed the case with the hospitalist, Dr. Jannifer Franklin, for admission at 9:25 PM. The patient is a DO NOT RESUSCITATE and presents with a golden rod sheet stating the same.    Clinical Course     ____________________________________________   FINAL CLINICAL IMPRESSION(S) / ED DIAGNOSES  Final diagnoses:  Generalized weakness  Acute hyperkalemia  Hypothermia, initial encounter      NEW MEDICATIONS STARTED DURING THIS VISIT:  New Prescriptions   No medications on file     Note:  This document was prepared using Dragon voice recognition software and may include unintentional dictation errors.    Joanne Gavel, MD 04/16/16 2145

## 2016-04-16 NOTE — ED Triage Notes (Signed)
Pt to ED via [facility] from [facility] c/o leaning back. Per EMS staff reports pt is demented at baseline and usually leans to the right but today started to lean back. Pt alert and in no acute distress at this time.

## 2016-04-17 LAB — CBC
HCT: 29.8 % — ABNORMAL LOW (ref 35.0–47.0)
Hemoglobin: 9.9 g/dL — ABNORMAL LOW (ref 12.0–16.0)
MCH: 29.5 pg (ref 26.0–34.0)
MCHC: 33.2 g/dL (ref 32.0–36.0)
MCV: 88.9 fL (ref 80.0–100.0)
PLATELETS: 189 10*3/uL (ref 150–440)
RBC: 3.35 MIL/uL — ABNORMAL LOW (ref 3.80–5.20)
RDW: 13.7 % (ref 11.5–14.5)
WBC: 3.7 10*3/uL (ref 3.6–11.0)

## 2016-04-17 LAB — BLOOD CULTURE ID PANEL (REFLEXED)
Acinetobacter baumannii: NOT DETECTED
CANDIDA GLABRATA: NOT DETECTED
CANDIDA KRUSEI: NOT DETECTED
CANDIDA PARAPSILOSIS: NOT DETECTED
CANDIDA TROPICALIS: NOT DETECTED
Candida albicans: NOT DETECTED
Carbapenem resistance: NOT DETECTED
ESCHERICHIA COLI: NOT DETECTED
Enterobacter cloacae complex: NOT DETECTED
Enterobacteriaceae species: NOT DETECTED
Enterococcus species: NOT DETECTED
Haemophilus influenzae: NOT DETECTED
KLEBSIELLA OXYTOCA: NOT DETECTED
KLEBSIELLA PNEUMONIAE: NOT DETECTED
LISTERIA MONOCYTOGENES: NOT DETECTED
Methicillin resistance: NOT DETECTED
Neisseria meningitidis: NOT DETECTED
PROTEUS SPECIES: NOT DETECTED
Pseudomonas aeruginosa: NOT DETECTED
SERRATIA MARCESCENS: NOT DETECTED
STREPTOCOCCUS SPECIES: NOT DETECTED
Staphylococcus aureus (BCID): NOT DETECTED
Staphylococcus species: DETECTED — AB
Streptococcus agalactiae: NOT DETECTED
Streptococcus pneumoniae: NOT DETECTED
Streptococcus pyogenes: NOT DETECTED
Vancomycin resistance: NOT DETECTED

## 2016-04-17 LAB — BASIC METABOLIC PANEL
Anion gap: 4 — ABNORMAL LOW (ref 5–15)
BUN: 53 mg/dL — AB (ref 6–20)
CALCIUM: 8.9 mg/dL (ref 8.9–10.3)
CHLORIDE: 118 mmol/L — AB (ref 101–111)
CO2: 20 mmol/L — AB (ref 22–32)
CREATININE: 0.96 mg/dL (ref 0.44–1.00)
GFR calc Af Amer: 59 mL/min — ABNORMAL LOW (ref >60)
GFR calc non Af Amer: 51 mL/min — ABNORMAL LOW (ref >60)
GLUCOSE: 74 mg/dL (ref 65–99)
Potassium: 5.2 mmol/L — ABNORMAL HIGH (ref 3.5–5.1)
Sodium: 142 mmol/L (ref 135–145)

## 2016-04-17 MED ORDER — VANCOMYCIN HCL IN DEXTROSE 1-5 GM/200ML-% IV SOLN
1000.0000 mg | Freq: Once | INTRAVENOUS | Status: AC
  Administered 2016-04-17: 1000 mg via INTRAVENOUS
  Filled 2016-04-17: qty 200

## 2016-04-17 MED ORDER — SODIUM CHLORIDE 0.9 % IV SOLN
INTRAVENOUS | Status: DC
  Administered 2016-04-17 – 2016-04-18 (×4): via INTRAVENOUS

## 2016-04-17 MED ORDER — ENOXAPARIN SODIUM 40 MG/0.4ML ~~LOC~~ SOLN
40.0000 mg | SUBCUTANEOUS | Status: DC
  Administered 2016-04-17: 21:00:00 40 mg via SUBCUTANEOUS
  Filled 2016-04-17: qty 0.4

## 2016-04-17 NOTE — Plan of Care (Signed)
Problem: Education: Goal: Knowledge of [facility] Education information/materials will improve Outcome: Not Progressing Pt confused   

## 2016-04-17 NOTE — Progress Notes (Signed)
PHARMACY - PHYSICIAN COMMUNICATION CRITICAL VALUE ALERT - BLOOD CULTURE IDENTIFICATION (BCID)  Results for orders placed or performed during the hospital encounter of 04/16/16  Blood Culture ID Panel (Reflexed) (Collected: 04/17/2016 12:00 AM)  Result Value Ref Range   Enterococcus species NOT DETECTED NOT DETECTED   Vancomycin resistance NOT DETECTED NOT DETECTED   Listeria monocytogenes NOT DETECTED NOT DETECTED   Staphylococcus species DETECTED (A) NOT DETECTED   Staphylococcus aureus NOT DETECTED NOT DETECTED   Methicillin resistance NOT DETECTED NOT DETECTED   Streptococcus species NOT DETECTED NOT DETECTED   Streptococcus agalactiae NOT DETECTED NOT DETECTED   Streptococcus pneumoniae NOT DETECTED NOT DETECTED   Streptococcus pyogenes NOT DETECTED NOT DETECTED   Acinetobacter baumannii NOT DETECTED NOT DETECTED   Enterobacteriaceae species NOT DETECTED NOT DETECTED   Enterobacter cloacae complex NOT DETECTED NOT DETECTED   Escherichia coli NOT DETECTED NOT DETECTED   Klebsiella oxytoca NOT DETECTED NOT DETECTED   Klebsiella pneumoniae NOT DETECTED NOT DETECTED   Proteus species NOT DETECTED NOT DETECTED   Serratia marcescens NOT DETECTED NOT DETECTED   Carbapenem resistance NOT DETECTED NOT DETECTED   Haemophilus influenzae NOT DETECTED NOT DETECTED   Neisseria meningitidis NOT DETECTED NOT DETECTED   Pseudomonas aeruginosa NOT DETECTED NOT DETECTED   Candida albicans NOT DETECTED NOT DETECTED   Candida glabrata NOT DETECTED NOT DETECTED   Candida krusei NOT DETECTED NOT DETECTED   Candida parapsilosis NOT DETECTED NOT DETECTED   Candida tropicalis NOT DETECTED NOT DETECTED    Name of physician (or Provider) Contacted: Dr. Ara Kussmaul  Changes to prescribed antibiotics required: One dose of vancomycin ordered per MD  Lenis Noon, PharmD 04/17/2016  9:39 PM

## 2016-04-17 NOTE — Clinical Social Work Note (Signed)
Clinical Social Work Assessment  Patient Details  Name: Hayley Campbell MRN: [id number] Date of Birth: 21-Sep-1922  Date of referral:  04/17/16               Reason for consult:  Facility Placement                Permission sought to share information with:  Family Supports Permission granted to share information::  Yes, Release of Information Signed  Name::     Karma Greaser  Agency::     Relationship::  Daughter  Contact Information:  [phone]  Housing/Transportation Living arrangements for the past 2 months:  [place] of Information:  Adult Children Patient Interpreter Needed:  None Criminal Activity/Legal Involvement Pertinent to Current Situation/Hospitalization:  No - Comment as needed Significant Relationships:  Adult Children Lives with:  Facility Resident Do you feel safe going back to the place where you live?  Yes Need for family participation in patient care:  Yes (Comment)  Care giving concerns:  Patient admitted from a facility.   Social Worker assessment / plan:  Patient was sleeping comfortably. Patient has dementia; daughter, Lorriane Shire, was at bedside and provided all information. CSW provided information on CSW roles and levels of care.  Patient's baseline is ambulation with a walker and is independent with feeding. Patient needs some assistance with bathing and dressing. Patient uses adult diapers for continence issues, but is able to use toilet with assistance.  Patient's home facility is [facility]. Patient's daughter consents to higher level of care if necessary and wants her mother to return to [facility] when medically stable. PT recommendations pending.    Employment status:  Retired Nurse, adult PT Recommendations:  Not assessed at this time Information / Referral to community resources:     Patient/Family's Response to care:  Patient sleeping and daughter was  pleasant.  Patient/Family's Understanding of and Emotional Response to Diagnosis, Current Treatment, and Prognosis:  Patient's daughter was knowledgable about her mother's care and was receptive to hearing care options dependent on PT recommendation. Patient's daughter thanked CSW for information and support.  Emotional Assessment Appearance:  Appears stated age Attitude/Demeanor/Rapport:   (Patient sleeping, family pleasant) Affect (typically observed):   (Patient sleeping, family appropriate) Orientation:   (Patient has dementia) Alcohol / Substance use:    Psych involvement (Current and /or in the community):  No (Comment)  Discharge Needs  Concerns to be addressed:  Discharge Planning Concerns Readmission within the last 30 days:  No Current discharge risk:  Chronically ill Barriers to Discharge:  No Barriers Identified   Zettie Pho, LCSW 04/17/2016, 2:55 PM

## 2016-04-17 NOTE — NC FL2 (Addendum)
  [place] LEVEL OF CARE SCREENING TOOL     IDENTIFICATION  Patient Name: Hayley Campbell Birthdate: 23-Jun-1923 Sex: female Admission Date (Current Location): 04/16/2016  [facility] and [place] Number:  Engineering geologist and Address:  [facility], [address] [place], [place] [address]      Provider Number: [id number]  Attending Physician Name and Address:  Vaughan Basta, MD  Relative Name and Phone Number:       Current Level of Care: Hospital Recommended Level of Care: [place] Prior Approval Number:    Date Approved/Denied:   PASRR Number:    Discharge Plan: Other (Comment) (ALF)    Current Diagnoses: Patient Active Problem List   Diagnosis Date Noted  . Hypothermia 04/16/2016  . HTN (hypertension) 04/16/2016  . GERD (gastroesophageal reflux disease) 04/16/2016  . Breast cancer ([place]) 04/16/2016  . Dementia 04/16/2016  . CKD (chronic kidney disease), stage III 04/16/2016    Orientation RESPIRATION BLADDER Height & Weight     Self  Normal Incontinent Weight: 130 lb (59 kg) Height:  5\' 2"  (157.5 cm)  BEHAVIORAL SYMPTOMS/MOOD NEUROLOGICAL BOWEL NUTRITION STATUS      Incontinent    AMBULATORY STATUS COMMUNICATION OF NEEDS Skin   Supervision Verbally Normal                       Personal Care Assistance Level of Assistance  Bathing, Dressing Bathing Assistance: Limited assistance   Dressing Assistance: Limited assistance     Functional Limitations Info             SPECIAL CARE FACTORS FREQUENCY                       Contractures Contractures Info: Present    Additional Factors Info                    Discharge Medications: DISCHARGE MEDICATIONS:      Current Discharge Medication List       START taking these medications   Details  feeding supplement, ENSURE ENLIVE, (ENSURE ENLIVE) LIQD Take 237 mLs by mouth 2 (two) times daily between meals. Qty:  237 mL, Refills: 12         CONTINUE these medications which have NOT CHANGED   Details  Cholecalciferol (VITAMIN D3) 50000 units CAPS Take 50,000 Units by mouth every 30 (thirty) days. On the 14th of every month    letrozole (FEMARA) 2.5 MG tablet Take 2.5 mg by mouth daily.    lisinopril (PRINIVIL,ZESTRIL) 40 MG tablet Take 40 mg by mouth daily.    ranitidine (ZANTAC) 75 MG tablet Take 75 mg by mouth 2 (two) times daily.    spironolactone (ALDACTONE) 25 MG tablet Take 25 mg by mouth daily.    traZODone (DESYREL) 50 MG tablet Take 25 mg by mouth at bedtime.    fosfomycin (MONUROL) 3 g PACK Take 3 g by mouth once. Please mix in 8 oz of water, take by mouth once Qty: 3 g, Refills: 0        Relevant Imaging Results:  Relevant Lab Results:   Additional Information  [id number]  Zettie Pho, LCSW

## 2016-04-17 NOTE — Progress Notes (Signed)
[place] [facility] [place] NAME: Hayley Campbell    MR#:  [id number]  DATE OF BIRTH:  11-Jan-1923  SUBJECTIVE:  CHIEF COMPLAINT:   Chief Complaint  Patient presents with  . Other   Sent from NH with hypothermia, Her HCPOA- niece was in room- as per her- pt have dementia, but she feeds herself and walking all the time. Currently pt is altered mentally and not co-operative with exam or questions. Temp rise up after warming , refused to eat in morning.  REVIEW OF SYSTEMS:   Because of altered mental status and baseline dementia, she is not able to give a review of system. ROS  DRUG ALLERGIES:   Allergies  Allergen Reactions  . Darvon [Propoxyphene] Other (See Comments)    Reaction: unknown    VITALS:  Blood pressure (!) 87/41, pulse (!) 104, temperature 98.3 F (36.8 C), temperature source Oral, resp. rate 16, height 5\' 2"  (1.575 m), weight 59 kg (130 lb), SpO2 93 %.  PHYSICAL EXAMINATION:  GENERAL:  82 y.o.-year-old patient lying in the bed with no acute distress.  EYES: Pupils equal, round, reactive to light and accommodation. No scleral icterus. HEENT: Head atraumatic, normocephalic. Oropharynx and nasopharynx clear.  NECK:  Supple, no jugular venous distention. No thyroid enlargement, no tenderness.  LUNGS: Normal breath sounds bilaterally, no wheezing, or crepitation. No use of accessory muscles of respiration.  CARDIOVASCULAR: S1, S2 normal. No murmurs, rubs, or gallops.  ABDOMEN: Soft, nontender, nondistended. Bowel sounds present. No organomegaly or mass.  EXTREMITIES: No pedal edema, cyanosis, or clubbing.  NEUROLOGIC: Cranial nerves II through XII are intact. Patient is demented and not very cooperative with physical exam so not able to get her follow the commands, she moves her arms trying to prevent examination.  PSYCHIATRIC: The patient is alert and disoriented.  SKIN: No obvious rash, lesion, or ulcer.   Physical  Exam LABORATORY PANEL:   CBC  Recent Labs Lab 04/17/16 0000  WBC 3.7  HGB 9.9*  HCT 29.8*  PLT 189   ------------------------------------------------------------------------------------------------------------------  Chemistries   Recent Labs Lab 04/16/16 1955 04/17/16 0000  NA 141 142  K 5.7* 5.2*  CL 113* 118*  CO2 23 20*  GLUCOSE 126* 74  BUN 59* 53*  CREATININE 1.30* 0.96  CALCIUM 9.9 8.9  AST 35  --   ALT 32  --   ALKPHOS 94  --   BILITOT 0.6  --    ------------------------------------------------------------------------------------------------------------------  Cardiac Enzymes  Recent Labs Lab 04/16/16 1955  TROPONINI <0.03   ------------------------------------------------------------------------------------------------------------------  RADIOLOGY:  Dg Chest 2 View  Result Date: 04/16/2016 CLINICAL DATA:  82 year old female with weakness. Initial encounter. EXAM: CHEST  2 VIEW COMPARISON:  11/26/2015 and earlier. FINDINGS: Seated AP and lateral views of the chest mediastinal contours are normal aside from mild tortuosity of the aorta. Calcified aortic atherosclerosis. Incidental azygos fissure on the right. Visualized tracheal air column is within normal limits. No pneumothorax, pulmonary edema, pleural effusion or confluent pulmonary opacity. Diffuse osteopenia. No acute osseous abnormality identified. IMPRESSION: No acute cardiopulmonary abnormality. Calcified aortic atherosclerosis. Electronically Signed   By: Genevie Ann M.D.   On: 04/16/2016 20:35   Ct Head Wo Contrast  Result Date: 04/16/2016 CLINICAL DATA:  82 year old female with weakness. Dementia. Initial encounter. EXAM: CT HEAD WITHOUT CONTRAST TECHNIQUE: Contiguous axial images were obtained from the base of the skull through the vertex without intravenous contrast. COMPARISON:  Head CT without contrast 12/22/2010 and earlier. FINDINGS:  Visualized paranasal sinuses and mastoids are stable and well  pneumatized. Negative orbit and scalp soft tissues. Calcified atherosclerosis at the skull base. No acute osseous abnormality identified. Cerebral volume is not significantly changed since 2012. No midline shift, mass effect, or evidence of intracranial mass lesion. No ventriculomegaly. Mildly asymmetric extra-axial CSF about the cerebral hemispheres and in the left posterior fossa is stable. No acute intracranial hemorrhage identified. Mild progression of patchy bilateral cerebral white matter and basal ganglia hypodensity which appears fairly symmetric. No cortically based acute infarct identified. No suspicious intracranial vascular hyperdensity. IMPRESSION: 1.  No acute intracranial abnormality. 2. Mild progression since 2012 of white matter and basal ganglia changes compatible with chronic small vessel disease. Electronically Signed   By: Genevie Ann M.D.   On: 04/16/2016 20:52    ASSESSMENT AND PLAN:   Principal Problem:   Hypothermia Active Problems:   HTN (hypertension)   GERD (gastroesophageal reflux disease)   Breast cancer (HCC)   Dementia   CKD (chronic kidney disease), stage III  * Hypothermia - unclear etiology for her hypothermia.   sent blood and urine cultures, despite the fact that she has no leukocytosis and her UA does not look infected. Chest x-ray is clear.   TSH is normal.   Temperature is normal now with rewarming blankets.   We will keep watching for any signs of infection, most likely this was episode initiated by dehydration.  *   HTN (hypertension) -  After rewarming now, slight hypotension so for medications and continue IV fluid.     *  Breast cancer ([place]) - continue home meds, patient has a history of the same, is currently still on maintenance letrozole *  Dementia - continue home meds *  GERD (gastroesophageal reflux disease) - home dose H2 blocker *  CKD (chronic kidney disease), stage III - at baseline, avoid nephrotoxins and monitor * Hyperkalemia -  Kayexalate 1 dose, hold spironolactone.    All the records are reviewed and case discussed with Care Management/Social Workerr. Management plans discussed with the patient, family and they are in agreement.  CODE STATUS:DO NOT RESUSCITATE TOTAL TIME TAKING CARE OF THIS PATIENT: 35utes.   patient's power of attorney, her niece was present in the room and I discussed the plan with her.    POSSIBLE D/C IN 2-3 DAYS, DEPENDING ON CLINICAL CONDITION.   Vaughan Basta M.D on 04/17/2016   Between 7am to 6pm - Pager - [phone]  After 6pm go to [email or web address] - password [email or web address] [facility]  Office  [phone]  CC: Primary care physician; No PCP Per Patient  Note: This dictation was prepared with Dragon dictation along with smaller phrase technology. Any transcriptional errors that result from this process are unintentional.

## 2016-04-17 NOTE — Plan of Care (Signed)
Problem: Nutrition: Goal: Adequate nutrition will be maintained Outcome: Not Progressing Pt resistive to attempts to feeding by staff and guardian; holds bites/sips in mouth. Pulls away and pushes staff away when touches; guardian advises this is normal behavior for her. IVF's started; however, very positional with MD aware. Pt will not tolerate attempt to start new site at this time. MD advises to infuse IVF's as able when pt will position arm adequately.

## 2016-04-18 LAB — BASIC METABOLIC PANEL
Anion gap: 4 — ABNORMAL LOW (ref 5–15)
BUN: 30 mg/dL — AB (ref 6–20)
CHLORIDE: 119 mmol/L — AB (ref 101–111)
CO2: 18 mmol/L — ABNORMAL LOW (ref 22–32)
CREATININE: 0.91 mg/dL (ref 0.44–1.00)
Calcium: 8.6 mg/dL — ABNORMAL LOW (ref 8.9–10.3)
GFR calc Af Amer: >60 mL/min (ref >60)
GFR, EST NON AFRICAN AMERICAN: 54 mL/min — AB (ref >60)
GLUCOSE: 83 mg/dL (ref 65–99)
Potassium: 4.9 mmol/L (ref 3.5–5.1)
SODIUM: 141 mmol/L (ref 135–145)

## 2016-04-18 LAB — CBC
HEMATOCRIT: 32.8 % — AB (ref 35.0–47.0)
HEMOGLOBIN: 11 g/dL — AB (ref 12.0–16.0)
MCH: 30 pg (ref 26.0–34.0)
MCHC: 33.7 g/dL (ref 32.0–36.0)
MCV: 89.2 fL (ref 80.0–100.0)
PLATELETS: 186 10*3/uL (ref 150–440)
RBC: 3.67 MIL/uL — AB (ref 3.80–5.20)
RDW: 13.9 % (ref 11.5–14.5)
WBC: 5 10*3/uL (ref 3.6–11.0)

## 2016-04-18 LAB — URINE CULTURE: Culture: NO GROWTH

## 2016-04-18 MED ORDER — ENSURE ENLIVE PO LIQD
237.0000 mL | Freq: Two times a day (BID) | ORAL | Status: DC
  Administered 2016-04-18: 237 mL via ORAL

## 2016-04-18 MED ORDER — ENSURE ENLIVE PO LIQD: 237.0000 mL | Freq: Two times a day (BID) | ORAL | 12 refills | Status: DC

## 2016-04-18 NOTE — Progress Notes (Signed)
Patient discharged to [facility]. Spoke with [place] and niece regarding discharge. Will call EMS for transport.

## 2016-04-18 NOTE — Progress Notes (Signed)
Initial Nutrition Assessment  DOCUMENTATION CODES:   Not applicable  INTERVENTION:   -Ensure Enlive po BID, each supplement provides 350 kcal and 20 grams of protein  NUTRITION DIAGNOSIS:   Inadequate oral intake related to lethargy/confusion as evidenced by per patient/family report, meal completion < 50%.  GOAL:   Patient will meet greater than or equal to 90% of their needs  MONITOR:   PO intake, Supplement acceptance, Labs, Weight trends, Skin, I & O's  REASON FOR ASSESSMENT:   Consult Assessment of nutrition requirement/status  ASSESSMENT:   Hayley Campbell  is a 82 y.o. female who presents with Hypothermia. Patient is demented at baseline and unable to contribute to her history. History is taken from collateral information from the nursing facility where she resides. She presented to the ED today hypothermic. She has no other significant abnormality and workup. No signs or symptoms of infection, but her temperature is extremely low. Bare hugger was initiated in the ED and hospitalist called for observation.  Pt admitted with hypothermia. She is a resident on [facility].   Reviewed MAR from [facility]; noted pt is prescribed Boost BID between meals. Pt is weighed weekly and wt has been stable over the past month per ALF records.   Hx obtained from pt's niece at bedside, who is very involved in pt's care. Pt niece reports that pt's appetite is generally very good PTA, consuming 3 meals per day and consuming 75-100% at most meals. At baseline, pt does not require feeding assistance. Pt niece also confirms that pt receives Boost supplements PTA, but has not been accepting them as well, but believes she would drink more of them if she had better encouragement. Meal completion 0%; intake improved this morning- consumed all of her oatmeal and some juice for breakfast. Pt niece believes that poor po intake is likely related to unfamiliar environment and plan is to d/c back  to [facility] later today. She is confident PO intake will increase upon return.   Nutrition-Focused physical exam completed. Findings are mild to moderate fat depletion, mild to moderate muscle depletion, and no edema. Pt was ambulating halls of ALF without assistance PTA. Suspect fat and muscle depletion as a result of advanced age.   Labs reviewed.  Diet Order:  Diet Heart Room service appropriate? Yes; Fluid consistency: Thin  Skin:  Reviewed, no issues  Last BM:  PTA  Height:   Ht Readings from Last 1 Encounters:  04/16/16 5\' 2"  (1.575 m)    Weight:   Wt Readings from Last 1 Encounters:  04/16/16 130 lb (59 kg)    Ideal Body Weight:  50 kg  BMI:  Body mass index is 23.78 kg/m.  Estimated Nutritional Needs:   Kcal:  1400-1600  Protein:  60-70 grams  Fluid:  1.4-1.6 L  EDUCATION NEEDS:   Education needs addressed  Hayley Campbell A. Jimmye Norman, RD, LDN, CDE Pager: [phone] After hours Pager: [phone]

## 2016-04-18 NOTE — Progress Notes (Signed)
Clinical Social Worker was informed that patient will be medically ready to discharge to [facility]. Patient and her niece are in a agreement with plan. CSW called [place] - Admissions Coordinator at [facility] to confirm that patient's bed is ready. All discharge information faxed to [facility] via [place]. DNR added to discharge packet.   Patient will discharge to [facility] via EMS.  Ernest Pine, MSW, LCSW, [place] Clinical Social Worker [phone]

## 2016-04-18 NOTE — Discharge Summary (Signed)
[place] [facility] [place] NAME: Hayley Campbell    MR#:  [id number]  DATE OF BIRTH:  October 01, 1922  DATE OF ADMISSION:  04/16/2016 ADMITTING PHYSICIAN: Lance Coon, MD  DATE OF DISCHARGE: 04/18/2016  PRIMARY CARE PHYSICIAN: No PCP Per Patient    ADMISSION DIAGNOSIS:  Acute hyperkalemia [E87.5] Generalized weakness [R53.1] Hypothermia, initial encounter [T68.XXXA]  DISCHARGE DIAGNOSIS:  Principal Problem:   Hypothermia Active Problems:   HTN (hypertension)   GERD (gastroesophageal reflux disease)   Breast cancer (HCC)   Dementia   CKD (chronic kidney disease), stage III   Dehydration  SECONDARY DIAGNOSIS:   Past Medical History:  Diagnosis Date  . Cancer [facility]) 2014   left breast cancer  . CKD (chronic kidney disease), stage III   . Dementia   . GERD (gastroesophageal reflux disease)   . HLD (hyperlipidemia)   . HTN (hypertension)     HOSPITAL COURSE:    * Hypothermia - unclear etiology for her hypothermia.   sent blood and urine cultures, despite the fact that she has no leukocytosis and her UA does not look   infected. Chest x-ray is clear.   TSH is normal.   Temperature is normal now with rewarming blankets.   Stayed stable for >24 hrs in hospital.    She was not given any Abx on admission until 24 hrs in hospital- and improved.    One of the 2 blood cx reporting staph species- likely contamination, as pt did fine without any Abx.    I also spoke to her niece in room today- as per her- she is much batter and back to baseline.    D/c to NH today , no need for Abx.  * HTN (hypertension) -  After rewarming now, slight hypotension so  continue IV fluid. Stable.     *Breast cancer ([place]) - continue home meds, patient has a history of the same, is currently still on maintenance letrozole *Dementia - continue home meds *GERD (gastroesophageal reflux disease) - home dose H2 blocker *CKD (chronic kidney  disease), stage III - at baseline, avoid nephrotoxins and monitor * Hyperkalemia - Kayexalate 1 dose, hold spironolactone.    DISCHARGE CONDITIONS:   Stable.  CONSULTS OBTAINED:    DRUG ALLERGIES:   Allergies  Allergen Reactions  . Darvon [Propoxyphene] Other (See Comments)    Reaction: unknown    DISCHARGE MEDICATIONS:   Current Discharge Medication List    START taking these medications   Details  feeding supplement, ENSURE ENLIVE, (ENSURE ENLIVE) LIQD Take 237 mLs by mouth 2 (two) times daily between meals. Qty: 237 mL, Refills: 12      CONTINUE these medications which have NOT CHANGED   Details  Cholecalciferol (VITAMIN D3) 50000 units CAPS Take 50,000 Units by mouth every 30 (thirty) days. On the 14th of every month    letrozole (FEMARA) 2.5 MG tablet Take 2.5 mg by mouth daily.    lisinopril (PRINIVIL,ZESTRIL) 40 MG tablet Take 40 mg by mouth daily.    ranitidine (ZANTAC) 75 MG tablet Take 75 mg by mouth 2 (two) times daily.    spironolactone (ALDACTONE) 25 MG tablet Take 25 mg by mouth daily.    traZODone (DESYREL) 50 MG tablet Take 25 mg by mouth at bedtime.    fosfomycin (MONUROL) 3 g PACK Take 3 g by mouth once. Please mix in 8 oz of water, take by mouth once Qty: 3 g, Refills: 0  DISCHARGE INSTRUCTIONS:    Follow with PMD in 1-2 weeks.  If you experience worsening of your admission symptoms, develop shortness of breath, life threatening emergency, suicidal or homicidal thoughts you must seek medical attention immediately by calling 911 or calling your MD immediately  if symptoms less severe.  You Must read complete instructions/literature along with all the possible adverse reactions/side effects for all the Medicines you take and that have been prescribed to you. Take any new Medicines after you have completely understood and accept all the possible adverse reactions/side effects.   Please note  You were cared for by a hospitalist during  your hospital stay. If you have any questions about your discharge medications or the care you received while you were in the hospital after you are discharged, you can call the unit and asked to speak with the hospitalist on call if the hospitalist that took care of you is not available. Once you are discharged, your primary care physician will handle any further medical issues. Please note that NO REFILLS for any discharge medications will be authorized once you are discharged, as it is imperative that you return to your primary care physician (or establish a relationship with a primary care physician if you do not have one) for your aftercare needs so that they can reassess your need for medications and monitor your lab values.    Today   CHIEF COMPLAINT:   Chief Complaint  Patient presents with  . Other    HISTORY OF PRESENT ILLNESS:  Hayley Campbell  is a 82 y.o. female  presents with Hypothermia. Patient is demented at baseline and unable to contribute to her history. History is taken from collateral information from the nursing facility where she resides. She presented to the ED today hypothermic. She has no other significant abnormality and workup. No signs or symptoms of infection, but her temperature is extremely low. Bare hugger was initiated in the ED and hospitalist called for observation.   VITAL SIGNS:  Blood pressure (!) 143/87, pulse 72, temperature 97.4 F (36.3 C), temperature source Oral, resp. rate 18, height 5\' 2"  (1.575 m), weight 59 kg (130 lb), SpO2 99 %.  I/O:   Intake/Output Summary (Last 24 hours) at 04/18/16 1310 Last data filed at 04/18/16 0938  Gross per 24 hour  Intake             3282 ml  Output                0 ml  Net             3282 ml    PHYSICAL EXAMINATION:   GENERAL:  82 y.o.-year-old patient lying in the bed with no acute distress.  EYES: Pupils equal, round, reactive to light and accommodation. No scleral icterus. HEENT: Head atraumatic,  normocephalic. Oropharynx and nasopharynx clear.  NECK:  Supple, no jugular venous distention. No thyroid enlargement, no tenderness.  LUNGS: Normal breath sounds bilaterally, no wheezing, or crepitation. No use of accessory muscles of respiration.  CARDIOVASCULAR: S1, S2 normal. No murmurs, rubs, or gallops.  ABDOMEN: Soft, nontender, nondistended. Bowel sounds present. No organomegaly or mass.  EXTREMITIES: No pedal edema, cyanosis, or clubbing.  NEUROLOGIC: Cranial nerves II through XII are intact. Patient is demented and not very cooperative with physical exam so not able to get her follow the commands, she moves her arms trying to prevent examination.  PSYCHIATRIC: The patient is alert and disoriented.  SKIN: No obvious rash, lesion, or  ulcer.   DATA REVIEW:   CBC  Recent Labs Lab 04/18/16 0530  WBC 5.0  HGB 11.0*  HCT 32.8*  PLT 186    Chemistries   Recent Labs Lab 04/16/16 1955  04/18/16 0530  NA 141  < > 141  K 5.7*  < > 4.9  CL 113*  < > 119*  CO2 23  < > 18*  GLUCOSE 126*  < > 83  BUN 59*  < > 30*  CREATININE 1.30*  < > 0.91  CALCIUM 9.9  < > 8.6*  AST 35  --   --   ALT 32  --   --   ALKPHOS 94  --   --   BILITOT 0.6  --   --   < > = values in this interval not displayed.  Cardiac Enzymes  Recent Labs Lab 04/16/16 1955  TROPONINI <0.03    Microbiology Results  Results for orders placed or performed during the hospital encounter of 04/16/16  Urine culture     Status: None   Collection Time: 04/16/16  7:55 PM  Result Value Ref Range Status   Specimen Description URINE, RANDOM  Final   Special Requests NONE  Final   Culture NO GROWTH Performed at [facility]   Final   Report Status 04/18/2016 FINAL  Final  Culture, blood (Routine X 2) w Reflex to ID Panel     Status: None (Preliminary result)   Collection Time: 04/17/16 12:00 AM  Result Value Ref Range Status   Specimen Description BLOOD LEFT HAND  Final   Special Requests   Final     BOTTLES DRAWN AEROBIC AND ANAEROBIC [place]   Culture  Setup Time   Final    GRAM POSITIVE COCCI ANAEROBIC BOTTLE ONLY CRITICAL RESULT CALLED TO, READ BACK BY AND VERIFIED WITH: [facility] SWAYNE AT 2124 04/17/16.PMH CONFIRMED BY HS Organism ID to follow    Culture   Final    GRAM POSITIVE COCCI CULTURE REINCUBATED FOR BETTER GROWTH Performed at [facility]    Report Status PENDING  Incomplete  Culture, blood (Routine X 2) w Reflex to ID Panel     Status: None (Preliminary result)   Collection Time: 04/17/16 12:00 AM  Result Value Ref Range Status   Specimen Description BLOOD RIGHT HAND  Final   Special Requests   Final    BOTTLES DRAWN AEROBIC AND ANAEROBIC [place]   Culture NO GROWTH 1 DAY  Final   Report Status PENDING  Incomplete  Blood Culture ID Panel (Reflexed)     Status: Abnormal   Collection Time: 04/17/16 12:00 AM  Result Value Ref Range Status   Enterococcus species NOT DETECTED NOT DETECTED Final   Vancomycin resistance NOT DETECTED NOT DETECTED Final   Listeria monocytogenes NOT DETECTED NOT DETECTED Final   Staphylococcus species DETECTED (A) NOT DETECTED Final    Comment: CRITICAL RESULT CALLED TO, READ BACK BY AND VERIFIED WITH: [facility] SWAYNE AT 2124 04/17/16.PMH    Staphylococcus aureus NOT DETECTED NOT DETECTED Final   Methicillin resistance NOT DETECTED NOT DETECTED Final   Streptococcus species NOT DETECTED NOT DETECTED Final   Streptococcus agalactiae NOT DETECTED NOT DETECTED Final   Streptococcus pneumoniae NOT DETECTED NOT DETECTED Final   Streptococcus pyogenes NOT DETECTED NOT DETECTED Final   Acinetobacter baumannii NOT DETECTED NOT DETECTED Final   Enterobacteriaceae species NOT DETECTED NOT DETECTED Final   Enterobacter cloacae complex NOT DETECTED NOT DETECTED Final   Escherichia coli  NOT DETECTED NOT DETECTED Final   Klebsiella oxytoca NOT DETECTED NOT DETECTED Final   Klebsiella pneumoniae NOT DETECTED NOT DETECTED Final    Proteus species NOT DETECTED NOT DETECTED Final   Serratia marcescens NOT DETECTED NOT DETECTED Final   Carbapenem resistance NOT DETECTED NOT DETECTED Final   Haemophilus influenzae NOT DETECTED NOT DETECTED Final   Neisseria meningitidis NOT DETECTED NOT DETECTED Final   Pseudomonas aeruginosa NOT DETECTED NOT DETECTED Final   Candida albicans NOT DETECTED NOT DETECTED Final   Candida glabrata NOT DETECTED NOT DETECTED Final   Candida krusei NOT DETECTED NOT DETECTED Final   Candida parapsilosis NOT DETECTED NOT DETECTED Final   Candida tropicalis NOT DETECTED NOT DETECTED Final    RADIOLOGY:  Dg Chest 2 View  Result Date: 04/16/2016 CLINICAL DATA:  82 year old female with weakness. Initial encounter. EXAM: CHEST  2 VIEW COMPARISON:  11/26/2015 and earlier. FINDINGS: Seated AP and lateral views of the chest mediastinal contours are normal aside from mild tortuosity of the aorta. Calcified aortic atherosclerosis. Incidental azygos fissure on the right. Visualized tracheal air column is within normal limits. No pneumothorax, pulmonary edema, pleural effusion or confluent pulmonary opacity. Diffuse osteopenia. No acute osseous abnormality identified. IMPRESSION: No acute cardiopulmonary abnormality. Calcified aortic atherosclerosis. Electronically Signed   By: Genevie Ann M.D.   On: 04/16/2016 20:35   Ct Head Wo Contrast  Result Date: 04/16/2016 CLINICAL DATA:  82 year old female with weakness. Dementia. Initial encounter. EXAM: CT HEAD WITHOUT CONTRAST TECHNIQUE: Contiguous axial images were obtained from the base of the skull through the vertex without intravenous contrast. COMPARISON:  Head CT without contrast 12/22/2010 and earlier. FINDINGS: Visualized paranasal sinuses and mastoids are stable and well pneumatized. Negative orbit and scalp soft tissues. Calcified atherosclerosis at the skull base. No acute osseous abnormality identified. Cerebral volume is not significantly changed since 2012. No  midline shift, mass effect, or evidence of intracranial mass lesion. No ventriculomegaly. Mildly asymmetric extra-axial CSF about the cerebral hemispheres and in the left posterior fossa is stable. No acute intracranial hemorrhage identified. Mild progression of patchy bilateral cerebral white matter and basal ganglia hypodensity which appears fairly symmetric. No cortically based acute infarct identified. No suspicious intracranial vascular hyperdensity. IMPRESSION: 1.  No acute intracranial abnormality. 2. Mild progression since 2012 of white matter and basal ganglia changes compatible with chronic small vessel disease. Electronically Signed   By: Genevie Ann M.D.   On: 04/16/2016 20:52    EKG:   Orders placed or performed during the hospital encounter of 04/16/16  . ED EKG  . ED EKG  . EKG 12-Lead  . EKG 12-Lead  . EKG 12-Lead  . EKG 12-Lead  . EKG 12-Lead  . EKG 12-Lead      Management plans discussed with the patient, family and they are in agreement.  CODE STATUS:     Code Status Orders        Start     Ordered   04/16/16 2335  Do not attempt resuscitation (DNR)  Continuous    Question Answer Comment  In the event of cardiac or respiratory ARREST Do not call a "code blue"   In the event of cardiac or respiratory ARREST Do not perform Intubation, CPR, defibrillation or ACLS   In the event of cardiac or respiratory ARREST Use medication by any route, position, wound care, and other measures to relive pain and suffering. May use oxygen, suction and manual treatment of airway obstruction as needed for comfort.  04/16/16 2334    Code Status History    Date Active Date Inactive Code Status Order ID Comments User Context   This patient has a current code status but no historical code status.    Advance Directive Documentation   Flowsheet Row Most Recent Value  Type of Advance Directive  Out of facility DNR (pink MOST or yellow form)  Pre-existing out of facility DNR order  (yellow form or pink MOST form)  Yellow form placed in chart (order not valid for inpatient use)  "MOST" Form in Place?  No data      TOTAL TIME TAKING CARE OF THIS PATIENT: 35 minutes.    Vaughan Basta M.D on 04/18/2016 at 1:10 PM  Between 7am to 6pm - Pager - [phone]  After 6pm go to [email or web address] - password [email or web address] [facility]  Office  [phone]  CC: Primary care physician; No PCP Per Patient   Note: This dictation was prepared with Dragon dictation along with smaller phrase technology. Any transcriptional errors that result from this process are unintentional.

## 2016-04-19 LAB — CULTURE, BLOOD (ROUTINE X 2)

## 2016-04-22 LAB — CULTURE, BLOOD (ROUTINE X 2): CULTURE: NO GROWTH

## 2016-07-08 ENCOUNTER — Emergency Department: Payer: Medicare Other

## 2016-07-08 ENCOUNTER — Inpatient Hospital Stay
Admission: EM | Admit: 2016-07-08 | Discharge: 2016-07-10 | DRG: 641 | Disposition: A | Discharge status: Nursing Home (Includes ICF) | Payer: Medicare Other | Attending: Internal Medicine | Admitting: Internal Medicine

## 2016-07-08 DIAGNOSIS — R680 Hypothermia, not associated with low environmental temperature: Secondary | ICD-10-CM | POA: Diagnosis present

## 2016-07-08 DIAGNOSIS — K219 Gastro-esophageal reflux disease without esophagitis: Secondary | ICD-10-CM | POA: Diagnosis present

## 2016-07-08 DIAGNOSIS — A419 Sepsis, unspecified organism: Secondary | ICD-10-CM | POA: Diagnosis present

## 2016-07-08 DIAGNOSIS — S0083XA Contusion of other part of head, initial encounter: Secondary | ICD-10-CM | POA: Diagnosis present

## 2016-07-08 DIAGNOSIS — T68XXXA Hypothermia, initial encounter: Secondary | ICD-10-CM

## 2016-07-08 DIAGNOSIS — W06XXXA Fall from bed, initial encounter: Secondary | ICD-10-CM | POA: Diagnosis present

## 2016-07-08 DIAGNOSIS — R6510 Systemic inflammatory response syndrome (SIRS) of non-infectious origin without acute organ dysfunction: Secondary | ICD-10-CM

## 2016-07-08 DIAGNOSIS — Z79899 Other long term (current) drug therapy: Secondary | ICD-10-CM

## 2016-07-08 DIAGNOSIS — Y92129 Unspecified place in nursing home as the place of occurrence of the external cause: Secondary | ICD-10-CM

## 2016-07-08 DIAGNOSIS — E785 Hyperlipidemia, unspecified: Secondary | ICD-10-CM | POA: Diagnosis present

## 2016-07-08 DIAGNOSIS — I129 Hypertensive chronic kidney disease with stage 1 through stage 4 chronic kidney disease, or unspecified chronic kidney disease: Secondary | ICD-10-CM | POA: Diagnosis present

## 2016-07-08 DIAGNOSIS — Z66 Do not resuscitate: Secondary | ICD-10-CM | POA: Diagnosis present

## 2016-07-08 DIAGNOSIS — E872 Acidosis: Principal | ICD-10-CM | POA: Diagnosis present

## 2016-07-08 DIAGNOSIS — F0390 Unspecified dementia without behavioral disturbance: Secondary | ICD-10-CM | POA: Diagnosis present

## 2016-07-08 DIAGNOSIS — Z79811 Long term (current) use of aromatase inhibitors: Secondary | ICD-10-CM

## 2016-07-08 DIAGNOSIS — W19XXXA Unspecified fall, initial encounter: Secondary | ICD-10-CM

## 2016-07-08 DIAGNOSIS — N183 Chronic kidney disease, stage 3 (moderate): Secondary | ICD-10-CM | POA: Diagnosis present

## 2016-07-08 DIAGNOSIS — C50919 Malignant neoplasm of unspecified site of unspecified female breast: Secondary | ICD-10-CM | POA: Diagnosis present

## 2016-07-08 LAB — COMPREHENSIVE METABOLIC PANEL
ALT: 36 U/L (ref 14–54)
ANION GAP: 8 (ref 5–15)
AST: 43 U/L — AB (ref 15–41)
Albumin: 3.9 g/dL (ref 3.5–5.0)
Alkaline Phosphatase: 104 U/L (ref 38–126)
BUN: 50 mg/dL — AB (ref 6–20)
CHLORIDE: 111 mmol/L (ref 101–111)
CO2: 25 mmol/L (ref 22–32)
Calcium: 9.9 mg/dL (ref 8.9–10.3)
Creatinine, Ser: 1.08 mg/dL — ABNORMAL HIGH (ref 0.44–1.00)
GFR calc non Af Amer: 44 mL/min — ABNORMAL LOW (ref >60)
GFR, EST AFRICAN AMERICAN: 50 mL/min — AB (ref >60)
Glucose, Bld: 117 mg/dL — ABNORMAL HIGH (ref 65–99)
POTASSIUM: 4.3 mmol/L (ref 3.5–5.1)
Sodium: 144 mmol/L (ref 135–145)
TOTAL PROTEIN: 7.5 g/dL (ref 6.5–8.1)
Total Bilirubin: 0.8 mg/dL (ref 0.3–1.2)

## 2016-07-08 LAB — URINALYSIS COMPLETE WITH MICROSCOPIC (ARMC ONLY)
BILIRUBIN URINE: NEGATIVE
Bacteria, UA: NONE SEEN
Glucose, UA: NEGATIVE mg/dL
HGB URINE DIPSTICK: NEGATIVE
LEUKOCYTES UA: NEGATIVE
NITRITE: NEGATIVE
PH: 5 (ref 5.0–8.0)
PROTEIN: NEGATIVE mg/dL
RBC / HPF: NONE SEEN RBC/hpf (ref 0–5)
SPECIFIC GRAVITY, URINE: 1.017 (ref 1.005–1.030)
Squamous Epithelial / LPF: NONE SEEN

## 2016-07-08 LAB — CBC WITH DIFFERENTIAL/PLATELET
BASOS ABS: 0 10*3/uL (ref 0–0.1)
Basophils Relative: 0 %
EOS PCT: 0 %
Eosinophils Absolute: 0 10*3/uL (ref 0–0.7)
HEMATOCRIT: 34.6 % — AB (ref 35.0–47.0)
Hemoglobin: 11.6 g/dL — ABNORMAL LOW (ref 12.0–16.0)
LYMPHS PCT: 12 %
Lymphs Abs: 0.7 10*3/uL — ABNORMAL LOW (ref 1.0–3.6)
MCH: 29.1 pg (ref 26.0–34.0)
MCHC: 33.6 g/dL (ref 32.0–36.0)
MCV: 86.5 fL (ref 80.0–100.0)
MONO ABS: 0.4 10*3/uL (ref 0.2–0.9)
MONOS PCT: 7 %
Neutro Abs: 4.4 10*3/uL (ref 1.4–6.5)
Neutrophils Relative %: 81 %
PLATELETS: 199 10*3/uL (ref 150–440)
RBC: 4.01 MIL/uL (ref 3.80–5.20)
RDW: 15.9 % — AB (ref 11.5–14.5)
WBC: 5.5 10*3/uL (ref 3.6–11.0)

## 2016-07-08 NOTE — ED Notes (Signed)
Fall mats placed on floor. Bed alarm placed on patient.  

## 2016-07-08 NOTE — ED Notes (Signed)
Patient transported to CT 

## 2016-07-08 NOTE — ED Notes (Signed)
3 warm blankets applied to pt. Bear hugger as well. Warmed fluids started.

## 2016-07-08 NOTE — ED Triage Notes (Signed)
Pt presents from [facility] care, hx dementia. Talking about random stories, smiling and laughing, alert but confused. Pt had unwitnessed fall, hematoma to R side of head. Unknown if on blood thinners per EMS. DNR, EMS BP 150/79.

## 2016-07-08 NOTE — ED Provider Notes (Signed)
[facility] Emergency Department Provider Note    First MD Initiated Contact with Patient 07/08/16 2316     (approximate)  I have reviewed the triage vital signs and the nursing notes.   HISTORY  Chief Complaint Fall  Level V Caveat:  Severe Dementia  HPI Hayley Campbell is a 82 y.o. female severe end-stage dementia presenting from memory care facility at [facility] after an unwitnessed fall out of her bed. Patient arrives with hematoma to the right side of her head as well as ecchymosis on the right knee. She does not appear to be in any acute distress. She is a DO NOT RESUSCITATE. Vital signs are stable in route. She is not on any blood thinners.   Past Medical History:  Diagnosis Date  . Cancer [facility]) 2014   left breast cancer  . CKD (chronic kidney disease), stage III   . Dementia   . GERD (gastroesophageal reflux disease)   . HLD (hyperlipidemia)   . HTN (hypertension)   . HTN (hypertension)     Patient Active Problem List   Diagnosis Date Noted  . Sepsis ([place]) 07/09/2016  . Hypothermia 04/16/2016  . HTN (hypertension) 04/16/2016  . GERD (gastroesophageal reflux disease) 04/16/2016  . Breast cancer ([place]) 04/16/2016  . Dementia 04/16/2016  . CKD (chronic kidney disease), stage III 04/16/2016    Past Surgical History:  Procedure Laterality Date  . none      Prior to Admission medications   Medication Sig Start Date End Date Taking? Authorizing Provider  Cholecalciferol (VITAMIN D3) 50000 units CAPS Take 50,000 Units by mouth every 30 (thirty) days. On the 14th of every month   Yes Historical Provider, MD  feeding supplement, ENSURE ENLIVE, (ENSURE ENLIVE) LIQD Take 237 mLs by mouth 2 (two) times daily between meals. 04/18/16  Yes Vaughan Basta, MD  letrozole [facility]) 2.5 MG tablet Take 2.5 mg by mouth daily.   Yes Historical Provider, MD  lisinopril (PRINIVIL,ZESTRIL) 40 MG tablet Take 40 mg by mouth daily.   Yes Historical  Provider, MD  ranitidine (ZANTAC) 75 MG tablet Take 75 mg by mouth 2 (two) times daily.   Yes Historical Provider, MD  spironolactone (ALDACTONE) 25 MG tablet Take 25 mg by mouth daily.   Yes Historical Provider, MD  traZODone (DESYREL) 50 MG tablet Take 25 mg by mouth at bedtime.   Yes Historical Provider, MD  fosfomycin (MONUROL) 3 g PACK Take 3 g by mouth once. Please mix in 8 oz of water, take by mouth once Patient not taking: Reported on 07/09/2016 02/02/16   Delman Kitten, MD    Allergies Darvon [propoxyphene]  Family History  Problem Relation Age of Onset  . Family history unknown: Yes    Social History Social History  Substance Use Topics  . Smoking status: Never Smoker  . Smokeless tobacco: Never Used  . Alcohol use No    Review of Systems Unable to obtain 2/2 dementia. ____________________________________________   PHYSICAL EXAM:  VITAL SIGNS: Vitals:   07/09/16 0434 07/09/16 0538  BP: (!) 113/53 (!) 92/37  Pulse: 86 90  Resp: 18 20  Temp:  97.7 F (36.5 C)    Constitutional: alert but disoriented x 3.  Talking randomly. Eyes: Conjunctivae are normal. PERRL. EOMI. Head: 4cm ecchymosis and contusion to right forehead Nose: No congestion/rhinnorhea. Mouth/Throat: Mucous membranes are moist.  Oropharynx non-erythematous. Neck: No stridor. Painless ROM. No cervical spine tenderness to palpation Hematological/Lymphatic/Immunilogical: No cervical lymphadenopathy. Cardiovascular: Normal rate, regular  rhythm. Grossly normal heart sounds.  Good peripheral circulation. Respiratory: Normal respiratory effort.  No retractions. Lungs CTAB. Gastrointestinal: Soft and nontender. No distention. No abdominal bruits. No CVA tenderness. Musculoskeletal: full painless ROM of BLE and bUE.  Theres is superfical abrasion and ecchymosis to right anterior knee without effusion No joint effusions. Neurologic:  MAE spontaneously, no facial droop.  No focal neuro deficits grossly Skin:   Skin is warm, dry and intact. No rash noted.  ____________________________________________   LABS (all labs ordered are listed, but only abnormal results are displayed)  Results for orders placed or performed during the hospital encounter of 07/08/16 (from the past 24 hour(s))  CBC with Differential/Platelet     Status: Abnormal   Collection Time: 07/08/16 11:21 PM  Result Value Ref Range   WBC 5.5 3.6 - 11.0 K/uL   RBC 4.01 3.80 - 5.20 MIL/uL   Hemoglobin 11.6 (L) 12.0 - 16.0 g/dL   HCT 34.6 (L) 35.0 - 47.0 %   MCV 86.5 80.0 - 100.0 fL   MCH 29.1 26.0 - 34.0 pg   MCHC 33.6 32.0 - 36.0 g/dL   RDW 15.9 (H) 11.5 - 14.5 %   Platelets 199 150 - 440 K/uL   Neutrophils Relative % 81 %   Neutro Abs 4.4 1.4 - 6.5 K/uL   Lymphocytes Relative 12 %   Lymphs Abs 0.7 (L) 1.0 - 3.6 K/uL   Monocytes Relative 7 %   Monocytes Absolute 0.4 0.2 - 0.9 K/uL   Eosinophils Relative 0 %   Eosinophils Absolute 0.0 0 - 0.7 K/uL   Basophils Relative 0 %   Basophils Absolute 0.0 0 - 0.1 K/uL  Comprehensive metabolic panel     Status: Abnormal   Collection Time: 07/08/16 11:21 PM  Result Value Ref Range   Sodium 144 135 - 145 mmol/L   Potassium 4.3 3.5 - 5.1 mmol/L   Chloride 111 101 - 111 mmol/L   CO2 25 22 - 32 mmol/L   Glucose, Bld 117 (H) 65 - 99 mg/dL   BUN 50 (H) 6 - 20 mg/dL   Creatinine, Ser 1.08 (H) 0.44 - 1.00 mg/dL   Calcium 9.9 8.9 - 10.3 mg/dL   Total Protein 7.5 6.5 - 8.1 g/dL   Albumin 3.9 3.5 - 5.0 g/dL   AST 43 (H) 15 - 41 U/L   ALT 36 14 - 54 U/L   Alkaline Phosphatase 104 38 - 126 U/L   Total Bilirubin 0.8 0.3 - 1.2 mg/dL   GFR calc non Af Amer 44 (L) >60 mL/min   GFR calc Af Amer 50 (L) >60 mL/min   Anion gap 8 5 - 15  TSH     Status: None   Collection Time: 07/08/16 11:21 PM  Result Value Ref Range   TSH 1.997 0.350 - 4.500 uIU/mL  Urinalysis complete, with microscopic ([facility] only)     Status: Abnormal   Collection Time: 07/08/16 11:37 PM  Result Value Ref Range    Color, Urine YELLOW (A) YELLOW   APPearance CLEAR (A) CLEAR   Glucose, UA NEGATIVE NEGATIVE mg/dL   Bilirubin Urine NEGATIVE NEGATIVE   Ketones, ur TRACE (A) NEGATIVE mg/dL   Specific Gravity, Urine 1.017 1.005 - 1.030   Hgb urine dipstick NEGATIVE NEGATIVE   pH 5.0 5.0 - 8.0   Protein, ur NEGATIVE NEGATIVE mg/dL   Nitrite NEGATIVE NEGATIVE   Leukocytes, UA NEGATIVE NEGATIVE   RBC / HPF NONE SEEN 0 - 5 RBC/hpf  WBC, UA 0-5 0 - 5 WBC/hpf   Bacteria, UA NONE SEEN NONE SEEN   Squamous Epithelial / LPF NONE SEEN NONE SEEN   Mucous PRESENT   Lactic acid, plasma     Status: Abnormal   Collection Time: 07/08/16 11:37 PM  Result Value Ref Range   Lactic Acid, Venous 2.7 (HH) 0.5 - 1.9 mmol/L  Influenza panel by PCR (type A & B, H1N1)     Status: None   Collection Time: 07/09/16  1:17 AM  Result Value Ref Range   Influenza A By PCR NEGATIVE NEGATIVE   Influenza B By PCR NEGATIVE NEGATIVE  Lactic acid, plasma     Status: None   Collection Time: 07/09/16  2:42 AM  Result Value Ref Range   Lactic Acid, Venous 0.6 0.5 - 1.9 mmol/L  MRSA PCR Screening     Status: None   Collection Time: 07/09/16  6:20 AM  Result Value Ref Range   MRSA by PCR NEGATIVE NEGATIVE   ____________________________________________ ____________________________________________  RADIOLOGY  I personally reviewed all radiographic images ordered to evaluate for the above acute complaints and reviewed radiology reports and findings.  These findings were personally discussed with the patient.  Please see medical record for radiology report.  ____________________________________________   PROCEDURES  Procedure(s) performed: none    Critical Care performed: yes CRITICAL CARE Performed by: Merlyn Lot   Total critical care time: 35 minutes  Critical care time was exclusive of separately billable procedures and treating other patients.  Critical care was necessary to treat or prevent imminent or  life-threatening deterioration.  Critical care was time spent personally by me on the following activities: development of treatment plan with patient and/or surrogate as well as nursing, discussions with consultants, evaluation of patient's response to treatment, examination of patient, obtaining history from patient or surrogate, ordering and performing treatments and interventions, ordering and review of laboratory studies, ordering and review of radiographic studies, pulse oximetry and re-evaluation of patient's condition.  ____________________________________________   INITIAL IMPRESSION / ASSESSMENT AND PLAN / ED COURSE  Pertinent labs & imaging results that were available during my care of the patient were reviewed by me and considered in my medical decision making (see chart for details).  DDX:sah, iph, sdh, contusion, fracture,   Kasi Reddicks is a 82 y.o. who presents to the ED with a witnessed fall and injuries as described above.  Patient is AFVSS in ED. Exam as above. Given current presentation have considered the above differential.  Patient is demented therefore history significantly limited but patient does appear pleasant and in no acute distress. Radiographic imaging will be ordered to evaluate for acute traumatic injury.  The patient will be placed on continuous pulse oximetry and telemetry for monitoring.  Laboratory evaluation will be sent to evaluate for the above complaints.     Clinical Course  Comment By Time  Cartilage checked and patient is hypothermic.  Will add labs on for evaluation of sepsis. Merlyn Lot, MD 11/03 2347    Patient with Lactic acidosis and persistent hypothermia.  Source uncertain at this time.  CT imaging with [facility]ICA.  Mentation appears at baseline.  No evidence of pna.  No evidence of UTI.  Patient provided IVF and will require admission for broad spectrum Abx.     ____________________________________________   FINAL CLINICAL  IMPRESSION(S) / ED DIAGNOSES  Final diagnoses:  Fall, initial encounter  SIRS (systemic inflammatory response syndrome) (HCC)  Hypothermia, initial encounter  NEW MEDICATIONS STARTED DURING THIS VISIT:  Current Discharge Medication List       Note:  This document was prepared using Dragon voice recognition software and may include unintentional dictation errors.    Merlyn Lot, MD 07/09/16 [phone]

## 2016-07-09 ENCOUNTER — Emergency Department: Payer: Medicare Other

## 2016-07-09 DIAGNOSIS — E785 Hyperlipidemia, unspecified: Secondary | ICD-10-CM | POA: Diagnosis present

## 2016-07-09 DIAGNOSIS — Y92129 Unspecified place in nursing home as the place of occurrence of the external cause: Secondary | ICD-10-CM | POA: Diagnosis not present

## 2016-07-09 DIAGNOSIS — I129 Hypertensive chronic kidney disease with stage 1 through stage 4 chronic kidney disease, or unspecified chronic kidney disease: Secondary | ICD-10-CM | POA: Diagnosis present

## 2016-07-09 DIAGNOSIS — E872 Acidosis: Secondary | ICD-10-CM | POA: Diagnosis present

## 2016-07-09 DIAGNOSIS — R680 Hypothermia, not associated with low environmental temperature: Secondary | ICD-10-CM | POA: Diagnosis present

## 2016-07-09 DIAGNOSIS — K219 Gastro-esophageal reflux disease without esophagitis: Secondary | ICD-10-CM | POA: Diagnosis present

## 2016-07-09 DIAGNOSIS — S0083XA Contusion of other part of head, initial encounter: Secondary | ICD-10-CM | POA: Diagnosis present

## 2016-07-09 DIAGNOSIS — C50919 Malignant neoplasm of unspecified site of unspecified female breast: Secondary | ICD-10-CM | POA: Diagnosis present

## 2016-07-09 DIAGNOSIS — F0390 Unspecified dementia without behavioral disturbance: Secondary | ICD-10-CM | POA: Diagnosis present

## 2016-07-09 DIAGNOSIS — A419 Sepsis, unspecified organism: Secondary | ICD-10-CM | POA: Diagnosis present

## 2016-07-09 DIAGNOSIS — W06XXXA Fall from bed, initial encounter: Secondary | ICD-10-CM | POA: Diagnosis present

## 2016-07-09 DIAGNOSIS — N183 Chronic kidney disease, stage 3 (moderate): Secondary | ICD-10-CM | POA: Diagnosis present

## 2016-07-09 DIAGNOSIS — Z79811 Long term (current) use of aromatase inhibitors: Secondary | ICD-10-CM | POA: Diagnosis not present

## 2016-07-09 DIAGNOSIS — Z66 Do not resuscitate: Secondary | ICD-10-CM | POA: Diagnosis present

## 2016-07-09 DIAGNOSIS — Z79899 Other long term (current) drug therapy: Secondary | ICD-10-CM | POA: Diagnosis not present

## 2016-07-09 DIAGNOSIS — R6510 Systemic inflammatory response syndrome (SIRS) of non-infectious origin without acute organ dysfunction: Secondary | ICD-10-CM | POA: Diagnosis present

## 2016-07-09 LAB — INFLUENZA PANEL BY PCR (TYPE A & B)
INFLAPCR: NEGATIVE
Influenza B By PCR: NEGATIVE

## 2016-07-09 LAB — LACTIC ACID, PLASMA
LACTIC ACID, VENOUS: 0.6 mmol/L (ref 0.5–1.9)
LACTIC ACID, VENOUS: 2.7 mmol/L — AB (ref 0.5–1.9)

## 2016-07-09 LAB — TSH: TSH: 1.997 u[IU]/mL (ref 0.350–4.500)

## 2016-07-09 LAB — MRSA PCR SCREENING: MRSA by PCR: NEGATIVE

## 2016-07-09 MED ORDER — PIPERACILLIN-TAZOBACTAM 3.375 G IVPB
3.3750 g | Freq: Three times a day (TID) | INTRAVENOUS | Status: DC
  Administered 2016-07-09 – 2016-07-10 (×4): 3.375 g via INTRAVENOUS
  Filled 2016-07-09 (×4): qty 50

## 2016-07-09 MED ORDER — CEFEPIME-DEXTROSE 1 GM/50ML IV SOLR
1.0000 g | Freq: Once | INTRAVENOUS | Status: AC
  Administered 2016-07-09: 1 g via INTRAVENOUS
  Filled 2016-07-09: qty 50

## 2016-07-09 MED ORDER — VANCOMYCIN HCL IN DEXTROSE 1-5 GM/200ML-% IV SOLN
1000.0000 mg | INTRAVENOUS | Status: DC
  Administered 2016-07-09: 1000 mg via INTRAVENOUS
  Filled 2016-07-09: qty 200

## 2016-07-09 MED ORDER — FAMOTIDINE 20 MG PO TABS
20.0000 mg | ORAL_TABLET | Freq: Every day | ORAL | Status: DC
  Administered 2016-07-09 – 2016-07-10 (×2): 20 mg via ORAL
  Filled 2016-07-09 (×2): qty 1

## 2016-07-09 MED ORDER — SODIUM CHLORIDE 0.9 % IV SOLN
INTRAVENOUS | Status: DC
  Administered 2016-07-09: 06:00:00 via INTRAVENOUS

## 2016-07-09 MED ORDER — ACETAMINOPHEN 325 MG PO TABS: 650.0000 mg | ORAL_TABLET | Freq: Four times a day (QID) | ORAL | Status: DC | PRN

## 2016-07-09 MED ORDER — ONDANSETRON HCL 4 MG/2ML IJ SOLN
4.0000 mg | Freq: Four times a day (QID) | INTRAMUSCULAR | Status: DC | PRN
Start: 2016-07-09 — End: 2016-07-10

## 2016-07-09 MED ORDER — ENSURE ENLIVE PO LIQD
237.0000 mL | Freq: Two times a day (BID) | ORAL | Status: DC
  Administered 2016-07-10 (×2): 237 mL via ORAL

## 2016-07-09 MED ORDER — ACETAMINOPHEN 650 MG RE SUPP: 650.0000 mg | Freq: Four times a day (QID) | RECTAL | Status: DC | PRN

## 2016-07-09 MED ORDER — VANCOMYCIN HCL IN DEXTROSE 1-5 GM/200ML-% IV SOLN
1000.0000 mg | Freq: Once | INTRAVENOUS | Status: AC
  Administered 2016-07-09: 1000 mg via INTRAVENOUS
  Filled 2016-07-09: qty 200

## 2016-07-09 MED ORDER — SODIUM CHLORIDE 0.9 % IV BOLUS (SEPSIS)
1000.0000 mL | Freq: Once | INTRAVENOUS | Status: AC
  Administered 2016-07-09: 1000 mL via INTRAVENOUS

## 2016-07-09 MED ORDER — DOCUSATE SODIUM 100 MG PO CAPS
100.0000 mg | ORAL_CAPSULE | Freq: Two times a day (BID) | ORAL | Status: DC
  Administered 2016-07-09 – 2016-07-10 (×3): 100 mg via ORAL
  Filled 2016-07-09 (×3): qty 1

## 2016-07-09 MED ORDER — LABETALOL HCL 5 MG/ML IV SOLN
5.0000 mg | INTRAVENOUS | Status: DC | PRN
  Filled 2016-07-09: qty 4

## 2016-07-09 MED ORDER — SODIUM CHLORIDE 0.9% FLUSH
3.0000 mL | Freq: Two times a day (BID) | INTRAVENOUS | Status: DC
  Administered 2016-07-09 – 2016-07-10 (×4): 3 mL via INTRAVENOUS

## 2016-07-09 MED ORDER — LETROZOLE 2.5 MG PO TABS
2.5000 mg | ORAL_TABLET | Freq: Every day | ORAL | Status: DC
  Administered 2016-07-09 – 2016-07-10 (×2): 2.5 mg via ORAL
  Filled 2016-07-09 (×2): qty 1

## 2016-07-09 MED ORDER — SPIRONOLACTONE 25 MG PO TABS
25.0000 mg | ORAL_TABLET | Freq: Every day | ORAL | Status: DC
  Administered 2016-07-09 – 2016-07-10 (×2): 25 mg via ORAL
  Filled 2016-07-09 (×2): qty 1

## 2016-07-09 MED ORDER — HEPARIN SODIUM (PORCINE) 5000 UNIT/ML IJ SOLN
5000.0000 [IU] | Freq: Three times a day (TID) | INTRAMUSCULAR | Status: DC
  Administered 2016-07-09 – 2016-07-10 (×5): 5000 [IU] via SUBCUTANEOUS
  Filled 2016-07-09 (×5): qty 1

## 2016-07-09 MED ORDER — VITAMIN D (ERGOCALCIFEROL) 1.25 MG (50000 UNIT) PO CAPS: 50000.0000 [IU] | ORAL_CAPSULE | ORAL | Status: DC

## 2016-07-09 MED ORDER — VANCOMYCIN HCL IN DEXTROSE 1-5 GM/200ML-% IV SOLN: 1000.0000 mg | INTRAVENOUS | Status: DC

## 2016-07-09 MED ORDER — ONDANSETRON HCL 4 MG PO TABS: 4.0000 mg | ORAL_TABLET | Freq: Four times a day (QID) | ORAL | Status: DC | PRN

## 2016-07-09 MED ORDER — HALOPERIDOL LACTATE 5 MG/ML IJ SOLN
1.0000 mg | Freq: Once | INTRAMUSCULAR | Status: AC
  Administered 2016-07-09: 23:00:00 1 mg via INTRAVENOUS
  Filled 2016-07-09: qty 1

## 2016-07-09 MED ORDER — VITAMIN D3 1.25 MG (50000 UT) PO CAPS: 50000.0000 [IU] | ORAL_CAPSULE | ORAL | Status: DC

## 2016-07-09 MED ORDER — TRAZODONE HCL 50 MG PO TABS
25.0000 mg | ORAL_TABLET | Freq: Every day | ORAL | Status: DC
Start: 2016-07-09 — End: 2016-07-10
  Administered 2016-07-09: 25 mg via ORAL
  Filled 2016-07-09: qty 1

## 2016-07-09 NOTE — Plan of Care (Signed)
Problem: Education: Goal: Knowledge of [facility] Education information/materials will improve Outcome: Completed/Met Date Met: 07/09/16 Pt unable due to limited cognition. Legal guardian well educated from previous experience.

## 2016-07-09 NOTE — ED Notes (Addendum)
Pt transported to room 111

## 2016-07-09 NOTE — Progress Notes (Signed)
Pt becomes agitated/resistive to care when touched. Refuses offers of po's/food. Held applesauce in mouth with med attempt. Aspiration precautions continued. Legal Guardian in and spoke with MD. Continued vancomysin and Zosyn.

## 2016-07-09 NOTE — Progress Notes (Signed)
[place] [facility] [place] NAME: Hayley Campbell    MRN#:  [id number]  DATE OF BIRTH:  02-12-1923  SUBJECTIVE:  Hospital Day: 0 days Hayley Campbell is a 82 y.o. female presenting with Fall .   Overnight events: No overnight events Interval Events: Unable to obtain, family member at bedtime  REVIEW OF SYSTEMS:  Unable to obtain given patient's mental status  DRUG ALLERGIES:   Allergies  Allergen Reactions  . Darvon [Propoxyphene] Other (See Comments)    Reaction: unknown    VITALS:  Blood pressure (!) 92/37, pulse 90, temperature 97.7 F (36.5 C), temperature source Oral, resp. rate 20, height 5\' 4"  (1.626 m), weight 55.9 kg (123 lb 4.8 oz), SpO2 98 %.  PHYSICAL EXAMINATION:   VITAL SIGNS: Vitals:   07/09/16 0434 07/09/16 0538  BP: (!) 113/53 (!) 92/37  Pulse: 86 90  Resp: 18 20  Temp:  97.7 F (36.5 C)   GENERAL:82 y.o.female moderate distress given mental status.  HEAD: Normocephalic, atraumatic.  EYES: Pupils equal, round, reactive to light. Unable to assess extraocular muscles given mental status/medical condition. No scleral icterus.  MOUTH: Moist mucosal membrane. Dentition intact. No abscess noted.  EAR, NOSE, THROAT: Clear without exudates. No external lesions.  NECK: Supple. No thyromegaly. No nodules. No JVD.  PULMONARY: Clear to ascultation, without wheeze rails or rhonci. No use of accessory muscles, Good respiratory effort. good air entry bilaterally CHEST: Nontender to palpation.  CARDIOVASCULAR: S1 and S2. Regular rate and rhythm. No murmurs, rubs, or gallops. No edema. Pedal pulses 2+ bilaterally.  GASTROINTESTINAL: Soft, nontender, nondistended. No masses. Positive bowel sounds. No hepatosplenomegaly.  MUSCULOSKELETAL: No swelling, clubbing, or edema. Range of motion full in all extremities.  NEUROLOGIC: Unable to assess given mental status/medical condition SKIN: No ulceration, lesions, rashes, or  cyanosis. Skin warm and dry. Turgor intact.  PSYCHIATRIC: Unable to assess given mental status/medical condition      LABORATORY PANEL:   CBC  Recent Labs Lab 07/08/16 2321  WBC 5.5  HGB 11.6*  HCT 34.6*  PLT 199   ------------------------------------------------------------------------------------------------------------------  Chemistries   Recent Labs Lab 07/08/16 2321  NA 144  K 4.3  CL 111  CO2 25  GLUCOSE 117*  BUN 50*  CREATININE 1.08*  CALCIUM 9.9  AST 43*  ALT 36  ALKPHOS 104  BILITOT 0.8   ------------------------------------------------------------------------------------------------------------------  Cardiac Enzymes No results for input(s): TROPONINI in the last 168 hours. ------------------------------------------------------------------------------------------------------------------  RADIOLOGY:  Dg Chest 2 View  Result Date: 07/09/2016 CLINICAL DATA:  Hypothermia and lactic acidosis. EXAM: CHEST  2 VIEW COMPARISON:  Chest radiograph 04/16/2016 FINDINGS: Cardiomediastinal contours are unchanged without cardiomegaly. No pleural effusion or pneumothorax. No focal airspace consolidation or pulmonary edema. Mild bibasilar atelectasis. IMPRESSION: No active cardiopulmonary disease. Electronically Signed   By: Ulyses Jarred M.D.   On: 07/09/2016 01:30   Dg Knee 2 Views Right  Result Date: 07/09/2016 CLINICAL DATA:  Pain and swelling after fall this evening EXAM: RIGHT KNEE - 1-2 VIEW COMPARISON:  None. FINDINGS: No evidence of fracture, dislocation, or joint effusion. No evidence of arthropathy or other focal bone abnormality. Soft tissues are unremarkable. IMPRESSION: Negative. Electronically Signed   By: Andreas Newport M.D.   On: 07/09/2016 00:00   Ct Head Wo Contrast  Result Date: 07/09/2016 CLINICAL DATA:  Acute onset of confusion. Status post unwitnessed fall, with right scalp hematoma. Initial encounter. EXAM: CT HEAD WITHOUT CONTRAST  TECHNIQUE: Contiguous axial images were  obtained from the base of the skull through the vertex without intravenous contrast. COMPARISON:  CT of the head performed 04/16/2016 FINDINGS: Brain: No evidence of acute infarction, hemorrhage, hydrocephalus, extra-axial collection or mass lesion/mass effect. Prominence of the ventricles and sulci reflects moderate cortical volume loss. Mild cerebellar atrophy is noted. Scattered periventricular and subcortical white matter change likely reflects small vessel ischemic microangiopathy. The brainstem and fourth ventricle are within normal limits. The basal ganglia are unremarkable in appearance. The cerebral hemispheres demonstrate grossly normal gray-white differentiation. No mass effect or midline shift is seen. Vascular: No hyperdense vessel or unexpected calcification. Skull: There is no evidence of fracture; visualized osseous structures are unremarkable in appearance. Sinuses/Orbits: The visualized portions of the orbits are within normal limits. The paranasal sinuses and mastoid air cells are well-aerated. Other: Soft tissue swelling is noted overlying the right frontal calvarium. IMPRESSION: 1. No evidence of traumatic intracranial injury or fracture. 2. Soft tissue swelling overlying the right frontal calvarium. 3. Moderate cortical volume loss and scattered small vessel ischemic microangiopathy. Electronically Signed   By: Garald Balding M.D.   On: 07/09/2016 00:36    EKG:   Orders placed or performed during the hospital encounter of 07/08/16  . ED EKG  . ED EKG    ASSESSMENT AND PLAN:   Hayley Campbell is a 82 y.o. female presenting with Fall . Admitted 07/08/2016 : Day #: 0 days 1. Sepsis: Ruled out patient did not meet septic criteria on admission currently on antibiotics we will await further culture data and then likely stop antibiotics if negative 2. Essential hypertension lisinopril on hold given her relative hypotension  3. GERD without  esophagitis: Pepcid 4. Dementia:   All the records are reviewed and case discussed with Care Management/Social Workerr. Management plans discussed with the patient, family and they are in agreement.  CODE STATUS: dnr TOTAL TIME TAKING CARE OF THIS PATIENT: 28 minutes.   POSSIBLE D/C IN 1DAYS, DEPENDING ON CLINICAL CONDITION.   Hower,  Karenann Cai.D on 07/09/2016 at 1:17 PM  Between 7am to 6pm - Pager - [phone]  After 6pm: House Pager: - [phone]  [facility]  Office  [phone]  CC: Primary care physician; No PCP Per Patient

## 2016-07-09 NOTE — Progress Notes (Addendum)
Pharmacy Antibiotic Note  Hayley Campbell is a 82 y.o. female admitted on 07/08/2016 with sepsis.  Pharmacy has been consulted for vancomycin and Zosyn dosing.  Plan: DW 59.4kg  Vd 42L kei 0.027 hr-1  T1/2 26 hours Vancomycin 1 gram q 36 hours orderd with stacked dosing. Level before 4th dose. Goal trough 15-20.  Zosyn 3.375 grams q 8 hours ordered.  Weight: 130 lb 15.3 oz (59.4 kg)  Temp (24hrs), Avg:94.5 F (34.7 C), Min:94.4 F (34.7 C), Max:94.5 F (34.7 C)   Recent Labs Lab 07/08/16 2321 07/08/16 2337  WBC 5.5  --   CREATININE 1.08*  --   LATICACIDVEN  --  2.7*    Estimated Creatinine Clearance: 26.8 mL/min (by C-G formula based on SCr of 1.08 mg/dL (H)).    Allergies  Allergen Reactions  . Darvon [Propoxyphene] Other (See Comments)    Reaction: unknown    Antimicrobials this admission: vancomycin 11/4 >>    >>   Dose adjustments this admission:   Microbiology results: 11/4 BCx: pending    11/3 UA: (-)  11/4 CXR: no active disease  Thank you for allowing pharmacy to be a part of this patient's care.  Anaih Brander S 07/09/2016 1:54 AM

## 2016-07-09 NOTE — H&P (Signed)
Hayley Campbell is an 82 y.o. female.   Chief Complaint: Fall HPI: The patient presents emergency department after suffering a fall at her nursing home. Past medical history is significant for dementia thus the patient cannot contribute much to her current history. He denies pain. Vital signs upon arrival were significant for core temperature of 94.30F. She was also intermittently tachypneic and tachycardic. Laboratory evaluation revealed lactic acidosis which prompted emergency department staff initiated septic protocol. After obtaining blood cultures broad-spectrum antibiotics were begun and the hospitalist service was called for admission.  Past Medical History:  Diagnosis Date  . Cancer [facility]) 2014   left breast cancer  . CKD (chronic kidney disease), stage III   . Dementia   . GERD (gastroesophageal reflux disease)   . HLD (hyperlipidemia)   . HTN (hypertension)   . HTN (hypertension)     Past Surgical History:  Procedure Laterality Date  . none      Family History  Problem Relation Age of Onset  . Family history unknown: Yes   Social History:  reports that she has never smoked. She has never used smokeless tobacco. She reports that she does not drink alcohol or use drugs.  Allergies:  Allergies  Allergen Reactions  . Darvon [Propoxyphene] Other (See Comments)    Reaction: unknown    Medications Prior to Admission  Medication Sig Dispense Refill  . Cholecalciferol (VITAMIN D3) 50000 units CAPS Take 50,000 Units by mouth every 30 (thirty) days. On the 14th of every month    . feeding supplement, ENSURE ENLIVE, (ENSURE ENLIVE) LIQD Take 237 mLs by mouth 2 (two) times daily between meals. 237 mL 12  . letrozole (FEMARA) 2.5 MG tablet Take 2.5 mg by mouth daily.    Marland Kitchen lisinopril (PRINIVIL,ZESTRIL) 40 MG tablet Take 40 mg by mouth daily.    . ranitidine (ZANTAC) 75 MG tablet Take 75 mg by mouth 2 (two) times daily.    Marland Kitchen spironolactone (ALDACTONE) 25 MG tablet Take 25 mg by  mouth daily.    . traZODone (DESYREL) 50 MG tablet Take 25 mg by mouth at bedtime.    . fosfomycin (MONUROL) 3 g PACK Take 3 g by mouth once. Please mix in 8 oz of water, take by mouth once (Patient not taking: Reported on 07/09/2016) 3 g 0    Results for orders placed or performed during the hospital encounter of 07/08/16 (from the past 48 hour(s))  CBC with Differential/Platelet     Status: Abnormal   Collection Time: 07/08/16 11:21 PM  Result Value Ref Range   WBC 5.5 3.6 - 11.0 K/uL   RBC 4.01 3.80 - 5.20 MIL/uL   Hemoglobin 11.6 (L) 12.0 - 16.0 g/dL   HCT 34.6 (L) 35.0 - 47.0 %   MCV 86.5 80.0 - 100.0 fL   MCH 29.1 26.0 - 34.0 pg   MCHC 33.6 32.0 - 36.0 g/dL   RDW 15.9 (H) 11.5 - 14.5 %   Platelets 199 150 - 440 K/uL   Neutrophils Relative % 81 %   Neutro Abs 4.4 1.4 - 6.5 K/uL   Lymphocytes Relative 12 %   Lymphs Abs 0.7 (L) 1.0 - 3.6 K/uL   Monocytes Relative 7 %   Monocytes Absolute 0.4 0.2 - 0.9 K/uL   Eosinophils Relative 0 %   Eosinophils Absolute 0.0 0 - 0.7 K/uL   Basophils Relative 0 %   Basophils Absolute 0.0 0 - 0.1 K/uL  Comprehensive metabolic panel     Status:  Abnormal   Collection Time: 07/08/16 11:21 PM  Result Value Ref Range   Sodium 144 135 - 145 mmol/L   Potassium 4.3 3.5 - 5.1 mmol/L   Chloride 111 101 - 111 mmol/L   CO2 25 22 - 32 mmol/L   Glucose, Bld 117 (H) 65 - 99 mg/dL   BUN 50 (H) 6 - 20 mg/dL   Creatinine, Ser 1.08 (H) 0.44 - 1.00 mg/dL   Calcium 9.9 8.9 - 10.3 mg/dL   Total Protein 7.5 6.5 - 8.1 g/dL   Albumin 3.9 3.5 - 5.0 g/dL   AST 43 (H) 15 - 41 U/L   ALT 36 14 - 54 U/L   Alkaline Phosphatase 104 38 - 126 U/L   Total Bilirubin 0.8 0.3 - 1.2 mg/dL   GFR calc non Af Amer 44 (L) >60 mL/min   GFR calc Af Amer 50 (L) >60 mL/min    Comment: (NOTE) The eGFR has been calculated using the CKD EPI equation. This calculation has not been validated in all clinical situations. eGFR's persistently <60 mL/min signify possible Chronic  Kidney Disease.    Anion gap 8 5 - 15  TSH     Status: None   Collection Time: 07/08/16 11:21 PM  Result Value Ref Range   TSH 1.997 0.350 - 4.500 uIU/mL    Comment: Performed by a 3rd Generation assay with a functional sensitivity of <=0.01 uIU/mL.  Urinalysis complete, with microscopic ([facility] only)     Status: Abnormal   Collection Time: 07/08/16 11:37 PM  Result Value Ref Range   Color, Urine YELLOW (A) YELLOW   APPearance CLEAR (A) CLEAR   Glucose, UA NEGATIVE NEGATIVE mg/dL   Bilirubin Urine NEGATIVE NEGATIVE   Ketones, ur TRACE (A) NEGATIVE mg/dL   Specific Gravity, Urine 1.017 1.005 - 1.030   Hgb urine dipstick NEGATIVE NEGATIVE   pH 5.0 5.0 - 8.0   Protein, ur NEGATIVE NEGATIVE mg/dL   Nitrite NEGATIVE NEGATIVE   Leukocytes, UA NEGATIVE NEGATIVE   RBC / HPF NONE SEEN 0 - 5 RBC/hpf   WBC, UA 0-5 0 - 5 WBC/hpf   Bacteria, UA NONE SEEN NONE SEEN   Squamous Epithelial / LPF NONE SEEN NONE SEEN   Mucous PRESENT   Lactic acid, plasma     Status: Abnormal   Collection Time: 07/08/16 11:37 PM  Result Value Ref Range   Lactic Acid, Venous 2.7 (HH) 0.5 - 1.9 mmol/L    Comment: CRITICAL RESULT CALLED TO, READ BACK BY AND VERIFIED WITH JENNA ELLINGTON AT 0018 07/09/16.PMH  Influenza panel by PCR (type A & B, H1N1)     Status: None   Collection Time: 07/09/16  1:17 AM  Result Value Ref Range   Influenza A By PCR NEGATIVE NEGATIVE   Influenza B By PCR NEGATIVE NEGATIVE    Comment: (NOTE) The Xpert Xpress Flu assay is intended as an aid in the diagnosis of  influenza and should not be used as a sole basis for treatment.  This  assay is FDA approved for nasopharyngeal swab specimens only. Nasal  washings and aspirates are unacceptable for Xpert Xpress Flu testing.   Lactic acid, plasma     Status: None   Collection Time: 07/09/16  2:42 AM  Result Value Ref Range   Lactic Acid, Venous 0.6 0.5 - 1.9 mmol/L   Dg Chest 2 View  Result Date: 07/09/2016 CLINICAL DATA:  Hypothermia  and lactic acidosis. EXAM: CHEST  2 VIEW COMPARISON:  Chest  radiograph 04/16/2016 FINDINGS: Cardiomediastinal contours are unchanged without cardiomegaly. No pleural effusion or pneumothorax. No focal airspace consolidation or pulmonary edema. Mild bibasilar atelectasis. IMPRESSION: No active cardiopulmonary disease. Electronically Signed   By: Ulyses Jarred M.D.   On: 07/09/2016 01:30   Dg Knee 2 Views Right  Result Date: 07/09/2016 CLINICAL DATA:  Pain and swelling after fall this evening EXAM: RIGHT KNEE - 1-2 VIEW COMPARISON:  None. FINDINGS: No evidence of fracture, dislocation, or joint effusion. No evidence of arthropathy or other focal bone abnormality. Soft tissues are unremarkable. IMPRESSION: Negative. Electronically Signed   By: Andreas Newport M.D.   On: 07/09/2016 00:00   Ct Head Wo Contrast  Result Date: 07/09/2016 CLINICAL DATA:  Acute onset of confusion. Status post unwitnessed fall, with right scalp hematoma. Initial encounter. EXAM: CT HEAD WITHOUT CONTRAST TECHNIQUE: Contiguous axial images were obtained from the base of the skull through the vertex without intravenous contrast. COMPARISON:  CT of the head performed 04/16/2016 FINDINGS: Brain: No evidence of acute infarction, hemorrhage, hydrocephalus, extra-axial collection or mass lesion/mass effect. Prominence of the ventricles and sulci reflects moderate cortical volume loss. Mild cerebellar atrophy is noted. Scattered periventricular and subcortical white matter change likely reflects small vessel ischemic microangiopathy. The brainstem and fourth ventricle are within normal limits. The basal ganglia are unremarkable in appearance. The cerebral hemispheres demonstrate grossly normal gray-white differentiation. No mass effect or midline shift is seen. Vascular: No hyperdense vessel or unexpected calcification. Skull: There is no evidence of fracture; visualized osseous structures are unremarkable in appearance. Sinuses/Orbits: The  visualized portions of the orbits are within normal limits. The paranasal sinuses and mastoid air cells are well-aerated. Other: Soft tissue swelling is noted overlying the right frontal calvarium. IMPRESSION: 1. No evidence of traumatic intracranial injury or fracture. 2. Soft tissue swelling overlying the right frontal calvarium. 3. Moderate cortical volume loss and scattered small vessel ischemic microangiopathy. Electronically Signed   By: Garald Balding M.D.   On: 07/09/2016 00:36    Review of Systems  Unable to perform ROS: Dementia    Blood pressure (!) 92/37, pulse 90, temperature 97.7 F (36.5 C), temperature source Oral, resp. rate 20, height _0  (1.626 m), weight 55.9 kg (123 lb 4.8 oz), SpO2 98 %. Physical Exam  Vitals reviewed. Constitutional: She is oriented to person, place, and time. She appears well-developed and well-nourished. No distress.  HENT:  Head: Normocephalic and atraumatic.  Mouth/Throat: Oropharynx is clear and moist.  Eyes: Conjunctivae and EOM are normal. Pupils are equal, round, and reactive to light. No scleral icterus.  Neck: Normal range of motion. Neck supple. No JVD present. No tracheal deviation present. No thyromegaly present.  Cardiovascular: Normal rate, regular rhythm and normal heart sounds.  Exam reveals no gallop and no friction rub.   No murmur heard. Respiratory: Effort normal and breath sounds normal.  GI: Soft. Bowel sounds are normal. She exhibits no distension. There is no tenderness.  Genitourinary:  Genitourinary Comments: Deferred  Musculoskeletal: Normal range of motion. She exhibits no edema.  Lymphadenopathy:    She has no cervical adenopathy.  Neurological: She is alert and oriented to person, place, and time. No cranial nerve deficit. She exhibits normal muscle tone.  Skin: Skin is warm and dry. No rash noted. No erythema.  Psychiatric: She has a normal mood and affect. Her behavior is normal. Judgment and thought content  normal.     Assessment/Plan This is a 82 year old female admitted for sepsis. 1. Sepsis: The  patient criteria via intermittent tachycardia and tachypnea as well as hypothermia. She is hemodynamically stable. Warming blanket is increasing core temperature well. Follow blood cultures for growth and sensitivities. Continue vancomycin and Zosyn. 2. Hypertension: Controlled; continue prophylactic tone. 3. History of breast cancer: Continue Letrozol 4. GERD: Continue Pepcid 5. Dementia: Trazodone for sundowning agitation 6. DVT prophylaxis: Heparin 7. GI prophylaxis: As above The patient is a DO NOT RESUSCITATE. Time spent on admission orders and patient care approximately 45 minutes   Harrie Foreman, MD 07/09/2016, 7:37 AM

## 2016-07-09 NOTE — Progress Notes (Signed)
   [place] [facility] Day: 0 days Hayley Campbell is a 82 y.o. female presenting with Fall .   Advance care planning discussed with patient  with additional Family at bedside. All questions in regards to overall condition and expected prognosis answered. The decision was made to change current code status  CODE STATUS: dnr Time spent: 18 minutes

## 2016-07-09 NOTE — Plan of Care (Signed)
Problem: Safety: Goal: Ability to remain free from injury will improve Outcome: Progressing Patient with fall from [facility] - very impulsive, trying to get out of bed this shift.  Bed alarm on and fall mats beside bed.  Problem: Health Behavior/Discharge Planning: Goal: Ability to manage health-related needs will improve Outcome: Not Progressing Patient with dementia, unable to communicate or follow commands.

## 2016-07-10 NOTE — Progress Notes (Signed)
Pt more alert/remains baseline confused. Calm and cooperative. Drank 2/3 ensure with pt assisting with holding bottle. Able to eat some breakfast and tolerated. Pt being discharged with SW,family aware.

## 2016-07-10 NOTE — Progress Notes (Signed)
[facility] here with pt to transport to [facility]. Discharged to [facility] in stable condition.

## 2016-07-10 NOTE — NC FL2 (Signed)
[place] LEVEL OF CARE SCREENING TOOL     IDENTIFICATION  Patient Name: Hayley Campbell Birthdate: May 19, 1923 Sex: female Admission Date (Current Location): 07/08/2016  [place] and [place] Number:  Engineering geologist and Address:  [facility], [address], [place], [place] [address]      Provider Number: [id number]  Attending Physician Name and Address:  Lytle Butte, MD  Relative Name and Phone Number:       Current Level of Care: Hospital Recommended Level of Care: [place] Prior Approval Number:    Date Approved/Denied:   PASRR Number:    Discharge Plan: Domiciliary (Rest home)    Current Diagnoses: Patient Active Problem List   Diagnosis Date Noted  . Sepsis ([place]) 07/09/2016  . Hypothermia 04/16/2016  . HTN (hypertension) 04/16/2016  . GERD (gastroesophageal reflux disease) 04/16/2016  . Breast cancer ([place]) 04/16/2016  . Dementia 04/16/2016  . CKD (chronic kidney disease), stage III 04/16/2016    Orientation RESPIRATION BLADDER Height & Weight      (Patient has dementia.)  Normal Incontinent Weight: 121 lb 3.2 oz (55 kg) Height:  5\' 4"  (162.6 cm)  BEHAVIORAL SYMPTOMS/MOOD NEUROLOGICAL BOWEL NUTRITION STATUS      Incontinent Diet, Supplemental (Prescribed Ensure for supplemental nutrition.)  AMBULATORY STATUS COMMUNICATION OF NEEDS Skin   Total Care Non-Verbally Normal                       Personal Care Assistance Level of Assistance  Bathing, Feeding, Dressing, Total care Bathing Assistance: Maximum assistance Feeding assistance: Limited assistance Dressing Assistance: Maximum assistance Total Care Assistance: Maximum assistance   Functional Limitations Info             SPECIAL CARE FACTORS FREQUENCY                       Contractures Contractures Info: Present    Additional Factors Info  Code Status, Allergies Code Status Info: DNR Allergies Info:  Darvon  Propoxyphene           Current Medications (07/10/2016):  This is the current hospital active medication list Current Facility-Administered Medications  Medication Dose Route Frequency Provider Last Rate Last Dose  . acetaminophen (TYLENOL) tablet 650 mg  650 mg Oral Q6H PRN Harrie Foreman, MD       Or  . acetaminophen (TYLENOL) suppository 650 mg  650 mg Rectal Q6H PRN Harrie Foreman, MD      . docusate sodium (COLACE) capsule 100 mg  100 mg Oral BID Harrie Foreman, MD   100 mg at 07/10/16 0931  . famotidine (PEPCID) tablet 20 mg  20 mg Oral Daily Harrie Foreman, MD   20 mg at 07/10/16 0931  . feeding supplement (ENSURE ENLIVE) (ENSURE ENLIVE) liquid 237 mL  237 mL Oral BID BM Harrie Foreman, MD   237 mL at 07/10/16 0943  . heparin injection 5,000 Units  5,000 Units Subcutaneous Q8H Harrie Foreman, MD   5,000 Units at 07/10/16 [phone]  . labetalol (NORMODYNE,TRANDATE) injection 5 mg  5 mg Intravenous Q2H PRN Harrie Foreman, MD      . letrozole [facility]) tablet 2.5 mg  2.5 mg Oral Daily Harrie Foreman, MD   2.5 mg at 07/10/16 0931  . ondansetron (ZOFRAN) tablet 4 mg  4 mg Oral Q6H PRN Harrie Foreman, MD       Or  .  ondansetron (ZOFRAN) injection 4 mg  4 mg Intravenous Q6H PRN Harrie Foreman, MD      . sodium chloride flush (NS) 0.9 % injection 3 mL  3 mL Intravenous Q12H Harrie Foreman, MD   3 mL at 07/10/16 0930  . spironolactone (ALDACTONE) tablet 25 mg  25 mg Oral Daily Harrie Foreman, MD   25 mg at 07/10/16 0931  . traZODone (DESYREL) tablet 25 mg  25 mg Oral QHS Harrie Foreman, MD   25 mg at 07/09/16 2059  . [START ON 07/19/2016] Vitamin D (Ergocalciferol) (DRISDOL) capsule 50,000 Units  50,000 Units Oral Q30 days Harrie Foreman, MD         Discharge Medications: Please see discharge summary for a list of discharge medications.  Relevant Imaging Results:  Relevant Lab Results:   Additional Information  SS [phone]  Zettie Pho,  LCSW

## 2016-07-10 NOTE — Discharge Summary (Signed)
[facility] [place] NAME: Hayley Campbell    MR#:  [id number]  DATE OF BIRTH:  [birth year]  DATE OF ADMISSION:  07/08/2016 ADMITTING PHYSICIAN: Harrie Foreman, MD  DATE OF DISCHARGE: 07/10/16  PRIMARY CARE PHYSICIAN: No PCP Per Patient    ADMISSION DIAGNOSIS:  SIRS (systemic inflammatory response syndrome) (HCC) [R65.10] Hypothermia, initial encounter [T68.XXXA] Fall, initial encounter [id number]]  DISCHARGE DIAGNOSIS:  Sepsis ruled out Dementia fall  SECONDARY DIAGNOSIS:   Past Medical History:  Diagnosis Date  . Cancer [facility]) 2014   left breast cancer  . CKD (chronic kidney disease), stage III   . Dementia   . GERD (gastroesophageal reflux disease)   . HLD (hyperlipidemia)   . HTN (hypertension)   . HTN (hypertension)     HOSPITAL COURSE:  Hayley Campbell  is a 82 y.o. female admitted 07/08/2016 with chief complaint Fall . Please see H&P performed by Harrie Foreman, MD for further information. Patient presented after fall in facility, no injuries. Noted to be hypothermic on admission - did NOT meet septic criteria on admission but started on empiric antibiotics in ED. Patient remained at baseline mental status, no infectious source found. Stable for discharge - case discussed with family members.   DISCHARGE CONDITIONS:   stable  CONSULTS OBTAINED:    DRUG ALLERGIES:   Allergies  Allergen Reactions  . Darvon [Propoxyphene] Other (See Comments)    Reaction: unknown    DISCHARGE MEDICATIONS:   Current Discharge Medication List    CONTINUE these medications which have NOT CHANGED   Details  Cholecalciferol (VITAMIN D3) 50000 units CAPS Take 50,000 Units by mouth every 30 (thirty) days. On the 14th of every month    feeding supplement, ENSURE ENLIVE, (ENSURE ENLIVE) LIQD Take 237 mLs by mouth 2 (two) times daily between meals. Qty: 237 mL, Refills: 12    letrozole (FEMARA) 2.5 MG tablet Take 2.5 mg by  mouth daily.    lisinopril (PRINIVIL,ZESTRIL) 40 MG tablet Take 40 mg by mouth daily.    ranitidine (ZANTAC) 75 MG tablet Take 75 mg by mouth 2 (two) times daily.    spironolactone (ALDACTONE) 25 MG tablet Take 25 mg by mouth daily.    traZODone (DESYREL) 50 MG tablet Take 25 mg by mouth at bedtime.      STOP taking these medications     fosfomycin (MONUROL) 3 g PACK          DISCHARGE INSTRUCTIONS:    DIET:  Regular diet  DISCHARGE CONDITION:  Stable  ACTIVITY:  Activity as tolerated  OXYGEN:  Home Oxygen: No.   Oxygen Delivery: room air  DISCHARGE LOCATION:  nursing home   If you experience worsening of your admission symptoms, develop shortness of breath, life threatening emergency, suicidal or homicidal thoughts you must seek medical attention immediately by calling 911 or calling your MD immediately  if symptoms less severe.  You Must read complete instructions/literature along with all the possible adverse reactions/side effects for all the Medicines you take and that have been prescribed to you. Take any new Medicines after you have completely understood and accpet all the possible adverse reactions/side effects.   Please note  You were cared for by a hospitalist during your hospital stay. If you have any questions about your discharge medications or the care you received while you were in the hospital after you are discharged, you can call the unit and asked to speak with the hospitalist  on call if the hospitalist that took care of you is not available. Once you are discharged, your primary care physician will handle any further medical issues. Please note that NO REFILLS for any discharge medications will be authorized once you are discharged, as it is imperative that you return to your primary care physician (or establish a relationship with a primary care physician if you do not have one) for your aftercare needs so that they can reassess your need for  medications and monitor your lab values.    On the day of Discharge:   VITAL SIGNS:  Blood pressure (!) 151/84, pulse 72, temperature 97.8 F (36.6 C), temperature source Axillary, resp. rate 20, height 5\' 4"  (1.626 m), weight 55 kg (121 lb 3.2 oz), SpO2 100 %.  I/O:   Intake/Output Summary (Last 24 hours) at 07/10/16 0901 Last data filed at 07/10/16 0606  Gross per 24 hour  Intake             1555 ml  Output                0 ml  Net             1555 ml    PHYSICAL EXAMINATION:  GENERAL:  82 y.o.-year-old patient lying in the bed with no acute distress.  EYES: Pupils equal, round, reactive to light and accommodation. No scleral icterus. Extraocular muscles intact.  HEENT: Head atraumatic, normocephalic. Oropharynx and nasopharynx clear.  NECK:  Supple, no jugular venous distention. No thyroid enlargement, no tenderness.  LUNGS: Normal breath sounds bilaterally, no wheezing, rales,rhonchi or crepitation. No use of accessory muscles of respiration.  CARDIOVASCULAR: S1, S2 normal. No murmurs, rubs, or gallops.  ABDOMEN: Soft, non-tender, non-distended. Bowel sounds present. No organomegaly or mass.  EXTREMITIES: No pedal edema, cyanosis, or clubbing.  NEUROLOGIC: Cranial nerves II through XII are intact. Muscle strength 5/5 in all extremities. Sensation intact. Gait not checked.  PSYCHIATRIC: The patient is awake and pleasantly confused SKIN: No obvious rash, lesion, or ulcer.   DATA REVIEW:   CBC  Recent Labs Lab 07/08/16 2321  WBC 5.5  HGB 11.6*  HCT 34.6*  PLT 199    Chemistries   Recent Labs Lab 07/08/16 2321  NA 144  K 4.3  CL 111  CO2 25  GLUCOSE 117*  BUN 50*  CREATININE 1.08*  CALCIUM 9.9  AST 43*  ALT 36  ALKPHOS 104  BILITOT 0.8    Cardiac Enzymes No results for input(s): TROPONINI in the last 168 hours.  Microbiology Results  Results for orders placed or performed during the hospital encounter of 07/08/16  Blood culture (routine x 2)      Status: None (Preliminary result)   Collection Time: 07/09/16  1:53 AM  Result Value Ref Range Status   Specimen Description BLOOD LEFT ARM  Final   Special Requests   Final    BOTTLES DRAWN AEROBIC AND ANAEROBIC 13CCAERO,9CCANA   Culture NO GROWTH 1 DAY  Final   Report Status PENDING  Incomplete  Blood culture (routine x 2)     Status: None (Preliminary result)   Collection Time: 07/09/16  1:58 AM  Result Value Ref Range Status   Specimen Description BLOOD RIGHT FOREARM  Final   Special Requests   Final    BOTTLES DRAWN AEROBIC AND ANAEROBIC 9CCAERO,15CCAERO   Culture NO GROWTH 1 DAY  Final   Report Status PENDING  Incomplete  MRSA PCR Screening     Status:  None   Collection Time: 07/09/16  6:20 AM  Result Value Ref Range Status   MRSA by PCR NEGATIVE NEGATIVE Final    Comment:        The GeneXpert MRSA Assay (FDA approved for NASAL specimens only), is one component of a comprehensive MRSA colonization surveillance program. It is not intended to diagnose MRSA infection nor to guide or monitor treatment for MRSA infections.     RADIOLOGY:  Dg Chest 2 View  Result Date: 07/09/2016 CLINICAL DATA:  Hypothermia and lactic acidosis. EXAM: CHEST  2 VIEW COMPARISON:  Chest radiograph 04/16/2016 FINDINGS: Cardiomediastinal contours are unchanged without cardiomegaly. No pleural effusion or pneumothorax. No focal airspace consolidation or pulmonary edema. Mild bibasilar atelectasis. IMPRESSION: No active cardiopulmonary disease. Electronically Signed   By: Ulyses Jarred M.D.   On: 07/09/2016 01:30   Dg Knee 2 Views Right  Result Date: 07/09/2016 CLINICAL DATA:  Pain and swelling after fall this evening EXAM: RIGHT KNEE - 1-2 VIEW COMPARISON:  None. FINDINGS: No evidence of fracture, dislocation, or joint effusion. No evidence of arthropathy or other focal bone abnormality. Soft tissues are unremarkable. IMPRESSION: Negative. Electronically Signed   By: Andreas Newport M.D.   On:  07/09/2016 00:00   Ct Head Wo Contrast  Result Date: 07/09/2016 CLINICAL DATA:  Acute onset of confusion. Status post unwitnessed fall, with right scalp hematoma. Initial encounter. EXAM: CT HEAD WITHOUT CONTRAST TECHNIQUE: Contiguous axial images were obtained from the base of the skull through the vertex without intravenous contrast. COMPARISON:  CT of the head performed 04/16/2016 FINDINGS: Brain: No evidence of acute infarction, hemorrhage, hydrocephalus, extra-axial collection or mass lesion/mass effect. Prominence of the ventricles and sulci reflects moderate cortical volume loss. Mild cerebellar atrophy is noted. Scattered periventricular and subcortical white matter change likely reflects small vessel ischemic microangiopathy. The brainstem and fourth ventricle are within normal limits. The basal ganglia are unremarkable in appearance. The cerebral hemispheres demonstrate grossly normal gray-white differentiation. No mass effect or midline shift is seen. Vascular: No hyperdense vessel or unexpected calcification. Skull: There is no evidence of fracture; visualized osseous structures are unremarkable in appearance. Sinuses/Orbits: The visualized portions of the orbits are within normal limits. The paranasal sinuses and mastoid air cells are well-aerated. Other: Soft tissue swelling is noted overlying the right frontal calvarium. IMPRESSION: 1. No evidence of traumatic intracranial injury or fracture. 2. Soft tissue swelling overlying the right frontal calvarium. 3. Moderate cortical volume loss and scattered small vessel ischemic microangiopathy. Electronically Signed   By: Garald Balding M.D.   On: 07/09/2016 00:36     Management plans discussed with the patient, family and they are in agreement.  CODE STATUS:     Code Status Orders        Start     Ordered   07/09/16 0458  Do not attempt resuscitation (DNR)  Continuous    Question Answer Comment  In the event of cardiac or respiratory  ARREST Do not call a "code blue"   In the event of cardiac or respiratory ARREST Do not perform Intubation, CPR, defibrillation or ACLS   In the event of cardiac or respiratory ARREST Use medication by any route, position, wound care, and other measures to relive pain and suffering. May use oxygen, suction and manual treatment of airway obstruction as needed for comfort.      07/09/16 0457    Code Status History    Date Active Date Inactive Code Status Order ID Comments User Context  04/16/2016 11:34 PM 04/18/2016  7:58 PM DNR [id number]  Lance Coon, MD Inpatient    Advance Directive Documentation   Flowsheet Row Most Recent Value  Type of Advance Directive  Out of facility DNR (pink MOST or yellow form)  Pre-existing out of facility DNR order (yellow form or pink MOST form)  No data  "MOST" Form in Place?  No data      TOTAL TIME TAKING CARE OF THIS PATIENT: 33 minutes.    Alijah Hyde,  Karenann Cai.D on 07/10/2016 at 9:01 AM  Between 7am to 6pm - Pager - [phone]  After 6pm go to [email or web address] - [employer] [facility]  Office  [phone]  CC: Primary care physician; No PCP Per Patient

## 2016-07-10 NOTE — Clinical Social Work Note (Signed)
Patient will dc back to [facility] today via non-emergent EMS due to dementia for safety reasons. Facility and patient's legal guardian are aware. CSW will con't to follow pending additional dc needs.  Santiago Bumpers, MSW, LCSW-A [phone]

## 2016-07-10 NOTE — Care Management Important Message (Signed)
Important Message  Patient Details  Name: Hayley Campbell MRN: [id number] Date of Birth: Jan 16, 1923   Medicare Important Message Given:  Yes    Merrie Epler A, RN 07/10/2016, 11:03 AM

## 2016-07-10 NOTE — Progress Notes (Signed)
Patient became increasingly restless and agitated. Attempts to get out of bed multiple times. MD Jannifer Franklin ordered for Haldol 1mg  IV x 1. Order carried out. Patient kept safe, fall precautions in place. Will continue to monitor.

## 2016-07-10 NOTE — Progress Notes (Signed)
TC to [facility] with report given to [facility], supervisor, with pt accepted. Discharge packet ready. EMS being called for routine transport to facility.

## 2016-07-11 LAB — HEMOGLOBIN A1C
Hgb A1c MFr Bld: 5 % (ref 4.8–5.6)
MEAN PLASMA GLUCOSE: 97 mg/dL

## 2016-07-14 LAB — CULTURE, BLOOD (ROUTINE X 2)
CULTURE: NO GROWTH
Culture: NO GROWTH

## 2016-08-31 ENCOUNTER — Encounter: Payer: Self-pay | Admitting: Emergency Medicine

## 2016-08-31 ENCOUNTER — Inpatient Hospital Stay
Admission: EM | Admit: 2016-08-31 | Discharge: 2016-09-03 | DRG: 871 | Disposition: A | Discharge status: Hospice | Payer: Medicare Other | Attending: Internal Medicine | Admitting: Internal Medicine

## 2016-08-31 ENCOUNTER — Emergency Department: Payer: Medicare Other

## 2016-08-31 DIAGNOSIS — J40 Bronchitis, not specified as acute or chronic: Secondary | ICD-10-CM | POA: Diagnosis present

## 2016-08-31 DIAGNOSIS — T68XXXA Hypothermia, initial encounter: Secondary | ICD-10-CM | POA: Diagnosis not present

## 2016-08-31 DIAGNOSIS — A419 Sepsis, unspecified organism: Secondary | ICD-10-CM | POA: Diagnosis present

## 2016-08-31 DIAGNOSIS — E785 Hyperlipidemia, unspecified: Secondary | ICD-10-CM | POA: Diagnosis present

## 2016-08-31 DIAGNOSIS — Z515 Encounter for palliative care: Secondary | ICD-10-CM | POA: Diagnosis not present

## 2016-08-31 DIAGNOSIS — N183 Chronic kidney disease, stage 3 (moderate): Secondary | ICD-10-CM | POA: Diagnosis present

## 2016-08-31 DIAGNOSIS — G9341 Metabolic encephalopathy: Secondary | ICD-10-CM | POA: Diagnosis present

## 2016-08-31 DIAGNOSIS — K219 Gastro-esophageal reflux disease without esophagitis: Secondary | ICD-10-CM | POA: Diagnosis present

## 2016-08-31 DIAGNOSIS — J690 Pneumonitis due to inhalation of food and vomit: Secondary | ICD-10-CM | POA: Diagnosis present

## 2016-08-31 DIAGNOSIS — I959 Hypotension, unspecified: Secondary | ICD-10-CM | POA: Diagnosis not present

## 2016-08-31 DIAGNOSIS — Z79899 Other long term (current) drug therapy: Secondary | ICD-10-CM

## 2016-08-31 DIAGNOSIS — Z66 Do not resuscitate: Secondary | ICD-10-CM | POA: Diagnosis present

## 2016-08-31 DIAGNOSIS — I129 Hypertensive chronic kidney disease with stage 1 through stage 4 chronic kidney disease, or unspecified chronic kidney disease: Secondary | ICD-10-CM | POA: Diagnosis present

## 2016-08-31 DIAGNOSIS — R4182 Altered mental status, unspecified: Secondary | ICD-10-CM | POA: Diagnosis present

## 2016-08-31 DIAGNOSIS — I248 Other forms of acute ischemic heart disease: Secondary | ICD-10-CM | POA: Diagnosis present

## 2016-08-31 DIAGNOSIS — F0390 Unspecified dementia without behavioral disturbance: Secondary | ICD-10-CM | POA: Diagnosis not present

## 2016-08-31 DIAGNOSIS — R6521 Severe sepsis with septic shock: Secondary | ICD-10-CM

## 2016-08-31 DIAGNOSIS — R410 Disorientation, unspecified: Secondary | ICD-10-CM | POA: Diagnosis not present

## 2016-08-31 DIAGNOSIS — E2740 Unspecified adrenocortical insufficiency: Secondary | ICD-10-CM | POA: Diagnosis present

## 2016-08-31 DIAGNOSIS — J9600 Acute respiratory failure, unspecified whether with hypoxia or hypercapnia: Secondary | ICD-10-CM | POA: Diagnosis present

## 2016-08-31 DIAGNOSIS — Z885 Allergy status to narcotic agent status: Secondary | ICD-10-CM

## 2016-08-31 DIAGNOSIS — J189 Pneumonia, unspecified organism: Secondary | ICD-10-CM | POA: Diagnosis not present

## 2016-08-31 DIAGNOSIS — Z853 Personal history of malignant neoplasm of breast: Secondary | ICD-10-CM | POA: Diagnosis not present

## 2016-08-31 DIAGNOSIS — R680 Hypothermia, not associated with low environmental temperature: Secondary | ICD-10-CM | POA: Diagnosis present

## 2016-08-31 DIAGNOSIS — R05 Cough: Secondary | ICD-10-CM | POA: Diagnosis present

## 2016-08-31 DIAGNOSIS — R0609 Other forms of dyspnea: Secondary | ICD-10-CM | POA: Diagnosis not present

## 2016-08-31 DIAGNOSIS — R0600 Dyspnea, unspecified: Secondary | ICD-10-CM

## 2016-08-31 LAB — CBC WITH DIFFERENTIAL/PLATELET
BASOS PCT: 0 %
Basophils Absolute: 0 10*3/uL (ref 0–0.1)
EOS ABS: 0 10*3/uL (ref 0–0.7)
EOS PCT: 0 %
HCT: 35.4 % (ref 35.0–47.0)
HEMOGLOBIN: 11.5 g/dL — AB (ref 12.0–16.0)
LYMPHS ABS: 0.4 10*3/uL — AB (ref 1.0–3.6)
Lymphocytes Relative: 4 %
MCH: 28 pg (ref 26.0–34.0)
MCHC: 32.6 g/dL (ref 32.0–36.0)
MCV: 85.9 fL (ref 80.0–100.0)
MONO ABS: 0.4 10*3/uL (ref 0.2–0.9)
MONOS PCT: 4 %
Neutro Abs: 9.3 10*3/uL — ABNORMAL HIGH (ref 1.4–6.5)
Neutrophils Relative %: 92 %
PLATELETS: 214 10*3/uL (ref 150–440)
RBC: 4.11 MIL/uL (ref 3.80–5.20)
RDW: 17.6 % — AB (ref 11.5–14.5)
WBC: 10.1 10*3/uL (ref 3.6–11.0)

## 2016-08-31 LAB — URINALYSIS, COMPLETE (UACMP) WITH MICROSCOPIC
Bacteria, UA: NONE SEEN
Bilirubin Urine: NEGATIVE
GLUCOSE, UA: NEGATIVE mg/dL
Ketones, ur: 20 mg/dL — AB
LEUKOCYTES UA: NEGATIVE
NITRITE: NEGATIVE
Protein, ur: NEGATIVE mg/dL
SPECIFIC GRAVITY, URINE: 1.02 (ref 1.005–1.030)
Squamous Epithelial / LPF: NONE SEEN
pH: 5 (ref 5.0–8.0)

## 2016-08-31 LAB — COMPREHENSIVE METABOLIC PANEL
ALK PHOS: 122 U/L (ref 38–126)
ALT: 24 U/L (ref 14–54)
ANION GAP: 8 (ref 5–15)
AST: 42 U/L — ABNORMAL HIGH (ref 15–41)
Albumin: 3.3 g/dL — ABNORMAL LOW (ref 3.5–5.0)
BUN: 44 mg/dL — ABNORMAL HIGH (ref 6–20)
CALCIUM: 10 mg/dL (ref 8.9–10.3)
CO2: 23 mmol/L (ref 22–32)
Chloride: 113 mmol/L — ABNORMAL HIGH (ref 101–111)
Creatinine, Ser: 0.97 mg/dL (ref 0.44–1.00)
GFR calc non Af Amer: 50 mL/min — ABNORMAL LOW (ref >60)
GFR, EST AFRICAN AMERICAN: 57 mL/min — AB (ref >60)
Glucose, Bld: 78 mg/dL (ref 65–99)
POTASSIUM: 4.9 mmol/L (ref 3.5–5.1)
SODIUM: 144 mmol/L (ref 135–145)
Total Bilirubin: 1.1 mg/dL (ref 0.3–1.2)
Total Protein: 7 g/dL (ref 6.5–8.1)

## 2016-08-31 LAB — BASIC METABOLIC PANEL
Anion gap: 4 — ABNORMAL LOW (ref 5–15)
BUN: 36 mg/dL — AB (ref 6–20)
CALCIUM: 8.4 mg/dL — AB (ref 8.9–10.3)
CO2: 22 mmol/L (ref 22–32)
CREATININE: 0.99 mg/dL (ref 0.44–1.00)
Chloride: 117 mmol/L — ABNORMAL HIGH (ref 101–111)
GFR calc Af Amer: 56 mL/min — ABNORMAL LOW (ref >60)
GFR, EST NON AFRICAN AMERICAN: 48 mL/min — AB (ref >60)
Glucose, Bld: 163 mg/dL — ABNORMAL HIGH (ref 65–99)
POTASSIUM: 3.9 mmol/L (ref 3.5–5.1)
SODIUM: 143 mmol/L (ref 135–145)

## 2016-08-31 LAB — BLOOD GAS, ARTERIAL
Acid-base deficit: 2.4 mmol/L — ABNORMAL HIGH (ref 0.0–2.0)
Bicarbonate: 22.5 mmol/L (ref 20.0–28.0)
FIO2: 0.21
O2 Saturation: 86.3 %
PATIENT TEMPERATURE: 37
pCO2 arterial: 38 mmHg (ref 32.0–48.0)
pH, Arterial: 7.38 (ref 7.350–7.450)
pO2, Arterial: 53 mmHg — ABNORMAL LOW (ref 83.0–108.0)

## 2016-08-31 LAB — TROPONIN I
TROPONIN I: 0.04 ng/mL — AB (ref <0.03)
Troponin I: <0.03 ng/mL (ref <0.03)

## 2016-08-31 LAB — CBC
HEMATOCRIT: 29.2 % — AB (ref 35.0–47.0)
Hemoglobin: 9.5 g/dL — ABNORMAL LOW (ref 12.0–16.0)
MCH: 27.8 pg (ref 26.0–34.0)
MCHC: 32.6 g/dL (ref 32.0–36.0)
MCV: 85.2 fL (ref 80.0–100.0)
PLATELETS: 180 10*3/uL (ref 150–440)
RBC: 3.43 MIL/uL — ABNORMAL LOW (ref 3.80–5.20)
RDW: 17.6 % — AB (ref 11.5–14.5)
WBC: 4.4 10*3/uL (ref 3.6–11.0)

## 2016-08-31 LAB — GLUCOSE, CAPILLARY
GLUCOSE-CAPILLARY: 78 mg/dL (ref 65–99)
GLUCOSE-CAPILLARY: 68 mg/dL (ref 65–99)
GLUCOSE-CAPILLARY: 107 mg/dL — AB (ref 65–99)
Glucose-Capillary: 85 mg/dL (ref 65–99)

## 2016-08-31 LAB — INFLUENZA PANEL BY PCR (TYPE A & B)
INFLAPCR: NEGATIVE
Influenza B By PCR: NEGATIVE

## 2016-08-31 LAB — LACTIC ACID, PLASMA: Lactic Acid, Venous: 1.3 mmol/L (ref 0.5–1.9)

## 2016-08-31 MED ORDER — PIPERACILLIN-TAZOBACTAM 3.375 G IVPB
3.3750 g | Freq: Three times a day (TID) | INTRAVENOUS | Status: DC
  Administered 2016-08-31 – 2016-09-01 (×3): 3.375 g via INTRAVENOUS
  Filled 2016-08-31 (×3): qty 50

## 2016-08-31 MED ORDER — PHENYLEPHRINE HCL 10 MG/ML IJ SOLN
0.0000 ug/min | Freq: Once | INTRAVENOUS | Status: AC
  Administered 2016-08-31: 20 ug/min via INTRAVENOUS
  Filled 2016-08-31: qty 1

## 2016-08-31 MED ORDER — INSULIN ASPART 100 UNIT/ML ~~LOC~~ SOLN
2.0000 [IU] | SUBCUTANEOUS | Status: DC
  Administered 2016-09-01: 2 [IU] via SUBCUTANEOUS
  Filled 2016-08-31: qty 2

## 2016-08-31 MED ORDER — SODIUM CHLORIDE 0.9 % IV BOLUS (SEPSIS)
250.0000 mL | Freq: Once | INTRAVENOUS | Status: AC
  Administered 2016-08-31: 250 mL via INTRAVENOUS

## 2016-08-31 MED ORDER — PIPERACILLIN-TAZOBACTAM 3.375 G IVPB 30 MIN
3.3750 g | Freq: Once | INTRAVENOUS | Status: AC
  Administered 2016-08-31: 3.375 g via INTRAVENOUS
  Filled 2016-08-31: qty 50

## 2016-08-31 MED ORDER — ACETAMINOPHEN 325 MG PO TABS: 650.0000 mg | ORAL_TABLET | ORAL | Status: DC | PRN

## 2016-08-31 MED ORDER — VANCOMYCIN HCL IN DEXTROSE 1-5 GM/200ML-% IV SOLN
1000.0000 mg | Freq: Once | INTRAVENOUS | Status: AC
  Administered 2016-08-31: 1000 mg via INTRAVENOUS
  Filled 2016-08-31: qty 200

## 2016-08-31 MED ORDER — PHENYLEPHRINE HCL 10 MG/ML IJ SOLN
0.0000 ug/min | INTRAMUSCULAR | Status: DC
  Administered 2016-08-31: 40 ug/min via INTRAVENOUS
  Administered 2016-09-01: 70 ug/min via INTRAVENOUS
  Administered 2016-09-01: 20 ug/min via INTRAVENOUS
  Filled 2016-08-31 (×8): qty 1

## 2016-08-31 MED ORDER — SODIUM CHLORIDE 0.9 % IV SOLN: 250.0000 mL | INTRAVENOUS | Status: DC | PRN

## 2016-08-31 MED ORDER — VANCOMYCIN HCL 500 MG IV SOLR
500.0000 mg | INTRAVENOUS | Status: DC
  Filled 2016-08-31: qty 500

## 2016-08-31 MED ORDER — FAMOTIDINE IN NACL 20-0.9 MG/50ML-% IV SOLN
20.0000 mg | Freq: Two times a day (BID) | INTRAVENOUS | Status: DC
  Administered 2016-08-31 – 2016-09-01 (×2): 20 mg via INTRAVENOUS
  Filled 2016-08-31 (×2): qty 50

## 2016-08-31 MED ORDER — HYDROCORTISONE NA SUCCINATE PF 100 MG IJ SOLR
50.0000 mg | Freq: Three times a day (TID) | INTRAMUSCULAR | Status: DC
  Administered 2016-08-31 – 2016-09-01 (×2): 50 mg via INTRAVENOUS
  Filled 2016-08-31 (×2): qty 2

## 2016-08-31 MED ORDER — SODIUM CHLORIDE 0.9 % IV BOLUS (SEPSIS)
500.0000 mL | Freq: Once | INTRAVENOUS | Status: AC
Start: 2016-08-31 — End: 2016-08-31
  Administered 2016-08-31: 500 mL via INTRAVENOUS

## 2016-08-31 MED ORDER — IPRATROPIUM-ALBUTEROL 0.5-2.5 (3) MG/3ML IN SOLN
3.0000 mL | Freq: Four times a day (QID) | RESPIRATORY_TRACT | Status: DC
  Administered 2016-08-31 – 2016-09-01 (×4): 3 mL via RESPIRATORY_TRACT
  Filled 2016-08-31 (×4): qty 3

## 2016-08-31 MED ORDER — SODIUM CHLORIDE 0.9 % IV SOLN
1000.0000 mL | Freq: Once | INTRAVENOUS | Status: AC
  Administered 2016-08-31: 1000 mL via INTRAVENOUS

## 2016-08-31 MED ORDER — DEXTROSE 50 % IV SOLN
25.0000 mL | Freq: Once | INTRAVENOUS | Status: AC
  Administered 2016-08-31: 25 mL via INTRAVENOUS
  Filled 2016-08-31: qty 50

## 2016-08-31 MED ORDER — ONDANSETRON HCL 4 MG/2ML IJ SOLN: 4.0000 mg | Freq: Four times a day (QID) | INTRAMUSCULAR | Status: DC | PRN

## 2016-08-31 MED ORDER — SODIUM CHLORIDE 0.9 % IV BOLUS (SEPSIS)
1000.0000 mL | Freq: Once | INTRAVENOUS | Status: AC
Start: 2016-08-31 — End: 2016-08-31
  Administered 2016-08-31: 1000 mL via INTRAVENOUS

## 2016-08-31 MED ORDER — ENOXAPARIN SODIUM 30 MG/0.3ML ~~LOC~~ SOLN
30.0000 mg | SUBCUTANEOUS | Status: DC
  Administered 2016-08-31: 30 mg via SUBCUTANEOUS
  Filled 2016-08-31: qty 0.3

## 2016-08-31 NOTE — ED Notes (Signed)
Lab called and notified of need of second set of blood cultures.

## 2016-08-31 NOTE — ED Provider Notes (Signed)
[facility] Emergency Department Provider Note   ____________________________________________    I have reviewed the triage vital signs and the nursing notes.   HISTORY  Chief Complaint Cough  History of present illness limited by dementia    HPI Hayley Campbell is a 82 y.o. female who was sent from nursing home for evaluation of cough. Reported low oxygen saturations at nursing home although normal per EMS and here. Productive cough. No fevers reported. History otherwise limited by dementia   Past Medical History:  Diagnosis Date  . Cancer [facility]) 2014   left breast cancer  . CKD (chronic kidney disease), stage III   . Dementia   . GERD (gastroesophageal reflux disease)   . HLD (hyperlipidemia)   . HTN (hypertension)   . HTN (hypertension)     Patient Active Problem List   Diagnosis Date Noted  . Sepsis ([place]) 07/09/2016  . Hypothermia 04/16/2016  . HTN (hypertension) 04/16/2016  . GERD (gastroesophageal reflux disease) 04/16/2016  . Breast cancer ([place]) 04/16/2016  . Dementia 04/16/2016  . CKD (chronic kidney disease), stage III 04/16/2016    Past Surgical History:  Procedure Laterality Date  . none      Prior to Admission medications   Medication Sig Start Date End Date Taking? Authorizing Provider  Cholecalciferol (VITAMIN D3) 50000 units CAPS Take 50,000 Units by mouth every 30 (thirty) days. On the 14th of every month    Historical Provider, MD  feeding supplement, ENSURE ENLIVE, (ENSURE ENLIVE) LIQD Take 237 mLs by mouth 2 (two) times daily between meals. 04/18/16   Vaughan Basta, MD  letrozole [facility]) 2.5 MG tablet Take 2.5 mg by mouth daily.    Historical Provider, MD  lisinopril (PRINIVIL,ZESTRIL) 40 MG tablet Take 40 mg by mouth daily.    Historical Provider, MD  ranitidine (ZANTAC) 75 MG tablet Take 75 mg by mouth 2 (two) times daily.    Historical Provider, MD  spironolactone (ALDACTONE) 25 MG tablet Take 25 mg  by mouth daily.    Historical Provider, MD  traZODone (DESYREL) 50 MG tablet Take 25 mg by mouth at bedtime.    Historical Provider, MD     Allergies Darvon [propoxyphene]  Family History  Problem Relation Age of Onset  . Family history unknown: Yes    Social History Social History  Substance Use Topics  . Smoking status: Never Smoker  . Smokeless tobacco: Never Used  . Alcohol use No    L5 caveat: Unable to obtain Review of Systems due to dementia   ____________________________________________   PHYSICAL EXAM:  VITAL SIGNS: ED Triage Vitals  Enc Vitals Group     BP 08/31/16 1243 140/67     Pulse Rate 08/31/16 1243 89     Resp 08/31/16 1243 17     Temp 08/31/16 1250 (!) 94.2 F (34.6 C)     Temp Source 08/31/16 1250 Rectal     SpO2 08/31/16 1243 97 %     Weight 08/31/16 1243 120 lb (54.4 kg)     Height 08/31/16 1243 5' (1.524 m)     Head Circumference --      Peak Flow --      Pain Score --      Pain Loc --      Pain Edu? --      Excl. in [place]? --     Constitutional: Alert. No acute distress.  Eyes: Conjunctivae are normal.  Head: Atraumatic. Nose: No congestion/rhinnorhea. Mouth/Throat: Mucous membranes  are moist.    Cardiovascular: Normal rate, regular rhythm. Grossly normal heart sounds.  Good peripheral circulation. Respiratory: Normal respiratory effort.  No retractions. Lungs CTAB. Positive productive cough Gastrointestinal: Soft and nontender. No distention.   Genitourinary: deferred Musculoskeletal: No lower extremity tenderness nor edema.  Warm and well perfused Neurologic:   No gross focal neurologic deficits are appreciated.  Skin:  Skin is warm, dry and intact. No rash noted. Psychiatric: Unable to examine  ____________________________________________   LABS (all labs ordered are listed, but only abnormal results are displayed)  Labs Reviewed - No data to display ____________________________________________  EKG  ED ECG REPORT I,  Lavonia Drafts, the attending physician, personally viewed and interpreted this ECG.  Date: 08/31/2016  Rate: 82  Rhythm: normal sinus rhythm QRS Axis: normal Intervals: normal ST/T Wave abnormalities: Nonspecific changes Conduction Disturbances: none   ____________________________________________  RADIOLOGY  Chest x-ray unremarkable ____________________________________________   PROCEDURES  Procedure(s) performed: No    Critical Care performed: yes  CRITICAL CARE Performed by: Lavonia Drafts   Total critical care time: 30 minutes  Critical care time was exclusive of separately billable procedures and treating other patients.  Critical care was necessary to treat or prevent imminent or life-threatening deterioration.  Critical care was time spent personally by me on the following activities: development of treatment plan with patient and/or surrogate as well as nursing, discussions with consultants, evaluation of patient's response to treatment, examination of patient, obtaining history from patient or surrogate, ordering and performing treatments and interventions, ordering and review of laboratory studies, ordering and review of radiographic studies, pulse oximetry and re-evaluation of patient's condition.  ____________________________________________   INITIAL IMPRESSION / ASSESSMENT AND PLAN / ED COURSE  Pertinent labs & imaging results that were available during my care of the patient were reviewed by me and considered in my medical decision making (see chart for details).  Patient presents with a cough, her rectal temp is 94.2. We will check labs, chest x-ray, placed a bear hugger and evaluate for possible pneumonia/sepsis.  Clinical Course   Patient's blood pressure has decreased, her labs are overall reassuring and her chest x-ray is unremarkable however strong suspicion for sepsis given her severe cough. We will treat broadly with vancomycin and Zosyn, call  a code sepsis and start IV fluids. I will discuss with the intensivist for admission ____________________________________________   FINAL CLINICAL IMPRESSION(S) / ED DIAGNOSES  Final diagnoses:  Sepsis, due to unspecified organism [facility])      NEW MEDICATIONS STARTED DURING THIS VISIT:  New Prescriptions   No medications on file     Note:  This document was prepared using Dragon voice recognition software and may include unintentional dictation errors.    Lavonia Drafts, MD 08/31/16 [phone]

## 2016-08-31 NOTE — Progress Notes (Signed)
Anticoagulation monitoring(Lovenox):  82 yo female ordered Lovenox 40 mg Q24h  Filed Weights   08/31/16 1243  Weight: 120 lb (54.4 kg)   BMI    Lab Results  Component Value Date   CREATININE 0.97 08/31/2016   CREATININE 1.08 (H) 07/08/2016   CREATININE 0.91 04/18/2016   Estimated Creatinine Clearance: 27.1 mL/min (by C-G formula based on SCr of 0.97 mg/dL). Hemoglobin & Hematocrit     Component Value Date/Time   HGB 11.5 (L) 08/31/2016 1251   HCT 35.4 08/31/2016 1251     Per Protocol for Patient with estCrcl < 30 ml/min and BMI < 40, will transition to Lovenox 30 mg Q24h.

## 2016-08-31 NOTE — ED Notes (Signed)
RT at bedside for ABG. Oral suction provided by RT.

## 2016-08-31 NOTE — ED Notes (Signed)
Respiratory called and notified of need of ABG.

## 2016-08-31 NOTE — H&P (Signed)
[facility] H&P    ASSESSMENT/PLAN   82 yo female resident of nursing home with advanced dementia presented with altered mental status, respiratory distress and hypotension. DNR.  PULMONARY A: Acute respiratory failure; CXR image reviewed;  no strong evidence of pneumonia, but patient is at high risk of aspiration.  -Cough with bronchitis.  P:   --Check flu.  --empiric abx for aspiration pneumonia.   CARDIOVASCULAR A: Hypotension likely due to septic shock.  --Initial troponin negative.  --Hypothermia.  P:  --Trend troponin.  --empiric steroids for adrenal insufficiency.  --continue IVF bolus.    RENAL A: --  GASTROINTESTINAL A:  --Gi prophylaxis.   HEMATOLOGIC A:  DVT prophylaxis.   INFECTIOUS A:  Septic shock, source uncertain, possibly aspiration pneumonia vs UTI.  P:    Micro/culture results:  BCx2 pending UC pending Sputum--  Antibiotics: Vancomycin 12/27>> Zosyn 12/27>>  ENDOCRINE A:  Check SSI with accuchecks.   NEUROLOGIC A:  Advanced dementia.  Metabolic encephalopathy.    MAJOR EVENTS/TEST RESULTS:   Best Practices  DVT Prophylaxis: enoxaparin GI Prophylaxis: famotidine.   Code Status: DNR; given advanced age and dementia, will attempt to avoid aggressive therapies.  ---------------------------------------  ---------------------------------------   Name: Ridley Spira MRN: [id number] DOB: 1922-10-09    ADMISSION DATE:  08/31/2016  CHIEF COMPLAINT:  hypotension   HISTORY OF PRESENT ILLNESS:   The patient is a 82 yo female with advanced dementia, she presents from NH with similar presentation as one month ago. She is noted to have temp of 94.5 rectal, she has cough and reduced mental status.  She is not able to provide history therefore all history is obtained via the chart and from staff.  She is noted to have a cough, but is awake.    PAST MEDICAL HISTORY :  Past Medical History:  Diagnosis  Date  . Cancer [facility]) 2014   left breast cancer  . CKD (chronic kidney disease), stage III   . Dementia   . GERD (gastroesophageal reflux disease)   . HLD (hyperlipidemia)   . HTN (hypertension)   . HTN (hypertension)    Past Surgical History:  Procedure Laterality Date  . none     Prior to Admission medications   Medication Sig Start Date End Date Taking? Authorizing Provider  Cholecalciferol (VITAMIN D3) 50000 units CAPS Take 50,000 Units by mouth every 30 (thirty) days. On the 14th of every month   Yes Historical Provider, MD  feeding supplement, ENSURE ENLIVE, (ENSURE ENLIVE) LIQD Take 237 mLs by mouth 2 (two) times daily between meals. 04/18/16  Yes Vaughan Basta, MD  guaiFENesin (MUCINEX) 600 MG 12 hr tablet Take 600 mg by mouth 2 (two) times daily.   Yes Historical Provider, MD  letrozole (FEMARA) 2.5 MG tablet Take 2.5 mg by mouth daily.   Yes Historical Provider, MD  lisinopril (PRINIVIL,ZESTRIL) 20 MG tablet Take 20 mg by mouth daily.    Yes Historical Provider, MD  Melatonin 1 MG TABS Take 1 mg by mouth at bedtime as needed.   Yes Historical Provider, MD  ranitidine (ZANTAC) 75 MG tablet Take 75 mg by mouth 2 (two) times daily.   Yes Historical Provider, MD   Allergies  Allergen Reactions  . Darvon [Propoxyphene] Other (See Comments)    Reaction: unknown    FAMILY HISTORY:  Family History  Problem Relation Age of Onset  . Family history unknown: Yes   SOCIAL HISTORY:  reports that she has never smoked. She  has never used smokeless tobacco. She reports that she does not drink alcohol or use drugs.  REVIEW OF SYSTEMS:   Could not provide due to advanced dementia.    VITAL SIGNS: Temp:  [94.2 F (34.6 C)] 94.2 F (34.6 C) (12/27 1250) Pulse Rate:  [75-89] 87 (12/27 1455) Resp:  [12-26] 14 (12/27 1455) BP: (69-140)/(40-67) 89/42 (12/27 1455) SpO2:  [96 %-98 %] 97 % (12/27 1455) Weight:  [120 lb (54.4 kg)] 120 lb (54.4 kg) (12/27 1243) HEMODYNAMICS:     VENTILATOR SETTINGS:   INTAKE / OUTPUT:  Intake/Output Summary (Last 24 hours) at 08/31/16 1522 Last data filed at 08/31/16 1521  Gross per 24 hour  Intake             2050 ml  Output                0 ml  Net             2050 ml    Physical Examination:   VS: BP (!) 89/42   Pulse 87   Temp (!) 94.2 F (34.6 C) (Rectal)   Resp 14   Ht 5' (1.524 m)   Wt 120 lb (54.4 kg)   SpO2 97%   BMI 23.44 kg/m   General Appearance: No distress  Neuro:without focal findings, mental status awake but does follow commands.  HEENT: PERRLA, EOM intact, no ptosis, no other lesions noticed;  Pulmonary: bilateral creps.  CardiovascularNormal S1,S2.  No m/r/g.    Abdomen: Benign, Soft, non-tender, No masses, hepatosplenomegaly, No lymphadenopathy Renal:  No costovertebral tenderness  GU:  Not performed at this time. Endoc: No evident thyromegaly, no signs of acromegaly. Skin:   warm, no rashes, no ecchymosis  Extremities: normal, no cyanosis, clubbing, no edema, warm with normal capillary refill.    LABS: Reviewed   LABORATORY PANEL:   CBC  Recent Labs Lab 08/31/16 1251  WBC 10.1  HGB 11.5*  HCT 35.4  PLT 214    Chemistries   Recent Labs Lab 08/31/16 1251  NA 144  K 4.9  CL 113*  CO2 23  GLUCOSE 78  BUN 44*  CREATININE 0.97  CALCIUM 10.0  AST 42*  ALT 24  ALKPHOS 122  BILITOT 1.1    No results for input(s): GLUCAP in the last 168 hours. No results for input(s): PHART, PCO2ART, PO2ART in the last 168 hours.  Recent Labs Lab 08/31/16 1251  AST 42*  ALT 24  ALKPHOS 122  BILITOT 1.1  ALBUMIN 3.3*    Cardiac Enzymes  Recent Labs Lab 08/31/16 1251  TROPONINI <0.03    RADIOLOGY:  Dg Chest Port 1 View  Result Date: 08/31/2016 CLINICAL DATA:  Upper respiratory infection EXAM: PORTABLE CHEST 1 VIEW COMPARISON:  None. FINDINGS: Cardiomediastinal silhouette is unremarkable. No pulmonary edema. No segmental infiltrate. Mild infrahilar bronchitic changes.  IMPRESSION: No segmental infiltrate or pulmonary edema. Mild infrahilar bronchitic changes. Electronically Signed   By: Lahoma Crocker M.D.   On: 08/31/2016 14:38       --Marda Stalker, MD.  Board Certified in Internal Medicine, Pulmonary Medicine, Olivia Lopez de Gutierrez, and Sleep Medicine.  ICU Pager [phone] [facility] Office Number: [phone]  Patricia Pesa, M.D.  Vilinda Boehringer, M.D.  Merton Border, M.D   08/31/2016, 3:22 PM  Critical Care Attestation.  I have personally obtained a history, examined the patient, evaluated laboratory and imaging results, formulated the assessment and plan and placed orders. The Patient requires high complexity  decision making for assessment and support, frequent evaluation and titration of therapies, application of advanced monitoring technologies and extensive interpretation of multiple databases. The patient has critical illness that could lead imminently to failure of 1 or more organ systems and requires the highest level of physician preparedness to intervene.  Critical Care Time devoted to patient care services described in this note is 45 minutes and is exclusive of time spent in procedures.

## 2016-08-31 NOTE — ED Notes (Signed)
Lab at bedside to collect 2nd set of blood cultures. Bear hugger applied to patient at this time.

## 2016-08-31 NOTE — Progress Notes (Signed)
NP Hinton Dyer made aware of patient's troponin level. Will continue to monitor.

## 2016-08-31 NOTE — ED Notes (Signed)
MD notified of BP

## 2016-08-31 NOTE — ED Notes (Signed)
Patient placed on 2L [place].

## 2016-08-31 NOTE — ED Notes (Signed)
In and out cath completed by this RN. Assisted by Jeannene Patella, NT and Timpson, NT. Patient tolerated well. Clear, yellow urine returned. Sterile technique maintained.

## 2016-08-31 NOTE — ED Notes (Signed)
Pharmacy called and notified of need of neo-synephrine. States they will send it down.

## 2016-08-31 NOTE — ED Notes (Signed)
Spoke with Leveda Anna from pharmacy. Advised not to start neo-synephrine through a peripheral line unless I have an MD order. Spoke with Dr. Madalyn Rob from [facility]. He states to go ahead and start drip.

## 2016-08-31 NOTE — ED Notes (Signed)
CODE SEPSIS CALLED TO DOUG AT [facility] 

## 2016-08-31 NOTE — ED Notes (Signed)
CSW received consult for pt being from a facility. CSW will follow pt and will be available should a social work ned arise. Pt is from [facility].   Georga Kaufmann, MSW, [place]

## 2016-08-31 NOTE — Progress Notes (Signed)
Pharmacy Antibiotic Note  Hayley Campbell is a 82 y.o. female NH resident admitted on 08/31/2016 with AMS, respiratory distress, and hypotension.  Pharmacy has been consulted for vancomycin and Zosyn dosing.  Plan: Zosyn 3.375 g EI q 8 hours.   Vancomycin 1000 mg iv once then 500 mg iv q 24 hours. Goal vancomycin trough 15-20 mcg/ml. Will adjust dosing and/or check vancomycin levels as clinically indicated.   Height: 5' (152.4 cm) Weight: 120 lb (54.4 kg) IBW/kg (Calculated) : 45.5  Temp (24hrs), Avg:94.2 F (34.6 C), Min:94.2 F (34.6 C), Max:94.2 F (34.6 C)   Recent Labs Lab 08/31/16 1251  WBC 10.1  CREATININE 0.97  LATICACIDVEN 1.3    Estimated Creatinine Clearance: 27.1 mL/min (by C-G formula based on SCr of 0.97 mg/dL).    Allergies  Allergen Reactions  . Darvon [Propoxyphene] Other (See Comments)    Reaction: unknown    Antimicrobials this admission: Vancomycin 12/27 >>  Zosyn 12/27 >>   Dose adjustments this admission:  Microbiology results: 12/27 BCx: sent 12/27 UCx: sent   Thank you for allowing pharmacy to be a part of this patient's care.  Ulice Dash D 08/31/2016 3:55 PM

## 2016-08-31 NOTE — ED Notes (Signed)
Lab called and notified of need of troponin draw. States they will come as soon as possible.

## 2016-08-31 NOTE — ED Triage Notes (Signed)
Per [facility], patient comes from [facility] dementia unit for a "chest cold" per EMS. Patient had a negative chest xray but MR MD wanted her to be evaluated. Staff at MR reported SpO2 was in the high 70's. When EMS arrived they reported SpO2 96% on RA. Patient combative during assessment. Does not answer questions or follows commands. Per EMS, this is patients baseline. DNR paperwork at bedside.

## 2016-08-31 NOTE — ED Notes (Signed)
Spoke with Dr. Juanell Fairly with ICU. Verbal order to start neo-synephrine.

## 2016-09-01 ENCOUNTER — Inpatient Hospital Stay: Payer: Medicare Other

## 2016-09-01 ENCOUNTER — Inpatient Hospital Stay
Admit: 2016-09-01 | Discharge: 2016-09-01 | Disposition: A | Discharge status: Home / Self Care | Payer: Medicare Other | Attending: Internal Medicine | Admitting: Internal Medicine

## 2016-09-01 DIAGNOSIS — J189 Pneumonia, unspecified organism: Secondary | ICD-10-CM

## 2016-09-01 DIAGNOSIS — R410 Disorientation, unspecified: Secondary | ICD-10-CM

## 2016-09-01 DIAGNOSIS — F0390 Unspecified dementia without behavioral disturbance: Secondary | ICD-10-CM

## 2016-09-01 LAB — BASIC METABOLIC PANEL
Anion gap: 5 (ref 5–15)
BUN: 30 mg/dL — AB (ref 6–20)
CHLORIDE: 117 mmol/L — AB (ref 101–111)
CO2: 22 mmol/L (ref 22–32)
CREATININE: 1.01 mg/dL — AB (ref 0.44–1.00)
Calcium: 8.6 mg/dL — ABNORMAL LOW (ref 8.9–10.3)
GFR calc Af Amer: 55 mL/min — ABNORMAL LOW (ref >60)
GFR calc non Af Amer: 47 mL/min — ABNORMAL LOW (ref >60)
Glucose, Bld: 142 mg/dL — ABNORMAL HIGH (ref 65–99)
POTASSIUM: 3.7 mmol/L (ref 3.5–5.1)
Sodium: 144 mmol/L (ref 135–145)

## 2016-09-01 LAB — MAGNESIUM: Magnesium: 1.7 mg/dL (ref 1.7–2.4)

## 2016-09-01 LAB — GLUCOSE, CAPILLARY
GLUCOSE-CAPILLARY: 100 mg/dL — AB (ref 65–99)
GLUCOSE-CAPILLARY: 113 mg/dL — AB (ref 65–99)
GLUCOSE-CAPILLARY: 126 mg/dL — AB (ref 65–99)
GLUCOSE-CAPILLARY: 132 mg/dL — AB (ref 65–99)
GLUCOSE-CAPILLARY: 81 mg/dL (ref 65–99)

## 2016-09-01 LAB — CBC
HEMATOCRIT: 32.2 % — AB (ref 35.0–47.0)
HEMOGLOBIN: 10.7 g/dL — AB (ref 12.0–16.0)
MCH: 28.5 pg (ref 26.0–34.0)
MCHC: 33.1 g/dL (ref 32.0–36.0)
MCV: 86.2 fL (ref 80.0–100.0)
Platelets: 200 10*3/uL (ref 150–440)
RBC: 3.74 MIL/uL — AB (ref 3.80–5.20)
RDW: 17.3 % — ABNORMAL HIGH (ref 11.5–14.5)
WBC: 9.3 10*3/uL (ref 3.6–11.0)

## 2016-09-01 LAB — ECHOCARDIOGRAM COMPLETE
Height: 60 in
Weight: 2109.36 oz

## 2016-09-01 LAB — URINE CULTURE: Culture: NO GROWTH

## 2016-09-01 LAB — TROPONIN I: Troponin I: 0.04 ng/mL (ref <0.03)

## 2016-09-01 LAB — MRSA PCR SCREENING: MRSA BY PCR: NEGATIVE

## 2016-09-01 MED ORDER — DEXTROSE-NACL 5-0.2 % IV SOLN: INTRAVENOUS | Status: DC

## 2016-09-01 MED ORDER — ORAL CARE MOUTH RINSE
15.0000 mL | Freq: Two times a day (BID) | OROMUCOSAL | Status: DC
  Administered 2016-09-01 – 2016-09-03 (×5): 15 mL via OROMUCOSAL

## 2016-09-01 MED ORDER — HALOPERIDOL LACTATE 5 MG/ML IJ SOLN: 1.0000 mg | INTRAMUSCULAR | Status: DC | PRN

## 2016-09-01 MED ORDER — MORPHINE SULFATE (PF) 4 MG/ML IV SOLN
2.0000 mg | INTRAVENOUS | Status: DC | PRN
  Administered 2016-09-01 – 2016-09-02 (×4): 2 mg via INTRAVENOUS
  Filled 2016-09-01 (×5): qty 1

## 2016-09-01 MED ORDER — HALOPERIDOL LACTATE 5 MG/ML IJ SOLN
1.0000 mg | INTRAMUSCULAR | Status: DC | PRN
  Administered 2016-09-01: 4 mg via INTRAVENOUS
  Filled 2016-09-01: qty 1

## 2016-09-01 NOTE — Progress Notes (Signed)
   Spoke to Niece Karma Greaser) who is her HCPOA..  Ms. Hayley Campbell want the patient to be comfortable and does not want to pursue aggressive medical care at this time.  Patient is not on any pressors  Or any other kind of drips.  The goal is to make her comfortable therefore explained that she can have morphine if she is in pain or distress and will let her pass away naturally.  Niece is in total agreement and understands the plan of care that is best for the patient.   [facility]

## 2016-09-01 NOTE — Progress Notes (Signed)
Pt is now comfort care.  Family went home for the night and asked [place] to call them if anything changes.  Pt is resting comfortably.

## 2016-09-01 NOTE — Progress Notes (Signed)
[facility] Progress Note Patient Name: Hayley Campbell DOB: July 29, 1923 MRN: [id number]   Date of Service  09/01/2016  HPI/Events of Note  Hypotension - BP 72/45 with MAP = 55.  Spoke with Karma Greaser who is the patient's niece, who voices that the family does not want the vasopressors restarted and desire comfort measures to allow the patient pass with comfort and dignity.  Patient is currently a DNR/DNI  eICU Interventions  Will not start vasopressors or morphine IV infusion at this time as the patient appears comfortable. Should the patient deteriorate further, would move to full comfort measures and morphine IV infusion for comfort.      Intervention Category Major Interventions: End of life / care limitation discussion  Lysle Dingwall 09/01/2016, 6:14 PM

## 2016-09-01 NOTE — Progress Notes (Signed)
Initial Nutrition Assessment  DOCUMENTATION CODES:   Not applicable  INTERVENTION:  -Recommend diet advancement as able; if concern regarding possible dysphagia, pt may benefit from SLP evaluation prior to diet advancement -Recommend addition of nutritional supplement once diet advanced  NUTRITION DIAGNOSIS:   Inadequate oral intake related to acute illness as evidenced by NPO status.  GOAL:   Patient will meet greater than or equal to 90% of their needs  MONITOR:   Diet advancement, Labs, Weight trends, PO intake  REASON FOR ASSESSMENT:   Malnutrition Screening Tool    ASSESSMENT:    82 yo female admitted with AMS, acute respiratory failure and hypotension, hypothermia with septic shock, possible aspiration penumonia vs UTI.  Pt with advanced dementia  Pt currently NPO. 2L [place]. Pt alert but disoriented x 4. Per weight encounters, weight has been trending down but wt loss not significant. BMI normal.   Labs:Creatinine 1.01, BUN 30 Meds: ss novolog, phenylephrine  Diet Order:  Diet NPO time specified Except for: Sips with Meds  Skin:  Reviewed, no issues (no pressure ulcers documented)  Last BM:  no documented BM  Height:   Ht Readings from Last 1 Encounters:  08/31/16 5' (1.524 m)    Weight:   Wt Readings from Last 1 Encounters:  09/01/16 131 lb 13.4 oz (59.8 kg)    Wt Readings from Last 10 Encounters:  09/01/16 131 lb 13.4 oz (59.8 kg)  07/10/16 121 lb 3.2 oz (55 kg)  04/16/16 130 lb (59 kg)  02/05/16 136 lb (61.7 kg)  01/29/16 138 lb 11.2 oz (62.9 kg)  11/26/15 130 lb (59 kg)  01/09/15 144 lb 2.9 oz (65.4 kg)    BMI:  Body mass index is 25.75 kg/m.  Estimated Nutritional Needs:   Kcal:  1500-1800 kcals   Protein:  72-84 g  Fluid:  >/= 1.5 L  EDUCATION NEEDS:   No education needs identified at this time [address], [place], [place] [phone] Pager  [phone] Weekend/On-Call Pager

## 2016-09-01 NOTE — Progress Notes (Signed)
Admitted for pneumonia and septic shock. DNR Discussed with Dr. Alva Garnet. Now off pressors and transferring to floor. Will take over care from tomorrow.

## 2016-09-01 NOTE — Progress Notes (Signed)
Report called to [facility].  Pt being moved to room 126.  Karma Greaser, niece, is aware of move.

## 2016-09-01 NOTE — Progress Notes (Signed)
*  PRELIMINARY RESULTS* Echocardiogram 2D Echocardiogram has been performed.  Hayley Campbell 09/01/2016, 9:43 AM

## 2016-09-01 NOTE — Progress Notes (Addendum)
No distress. Very poorly oriented. Not F/C consistently. Pulling at leads and catheters. Remains on vasopressors  Vitals:   09/01/16 0500 09/01/16 0515 09/01/16 0721 09/01/16 0800  BP: 126/88 97/85  (!) 72/53  Pulse: 90 87  85  Resp: (!) 21 20  (!) 22  Temp:    97.9 F (36.6 C)  TempSrc:    Axillary  SpO2: 94% 90% 94% 96%  Weight:      Height:       No overt distress HEENT WNL Neck supple Bilateral rhonchi, rattling cough, no wheeze Reg, no M NABS No edema  BMP Latest Ref Rng & Units 09/01/2016 08/31/2016 08/31/2016  Glucose 65 - 99 mg/dL 142(H) 163(H) 78  BUN 6 - 20 mg/dL 30(H) 36(H) 44(H)  Creatinine 0.44 - 1.00 mg/dL 1.01(H) 0.99 0.97  Sodium 135 - 145 mmol/L 144 143 144  Potassium 3.5 - 5.1 mmol/L 3.7 3.9 4.9  Chloride 101 - 111 mmol/L 117(H) 117(H) 113(H)  CO2 22 - 32 mmol/L 22 22 23   Calcium 8.9 - 10.3 mg/dL 8.6(L) 8.4(L) 10.0   CBC Latest Ref Rng & Units 09/01/2016 08/31/2016 08/31/2016  WBC 3.6 - 11.0 K/uL 9.3 4.4 10.1  Hemoglobin 12.0 - 16.0 g/dL 10.7(L) 9.5(L) 11.5(L)  Hematocrit 35.0 - 47.0 % 32.2(L) 29.2(L) 35.4  Platelets 150 - 440 K/uL 200 180 214   CXR: New/increasing linear and patchy opacity at the right lung base   IMPRESSION: 1) Advanced dementia 2) Hypotension, resolved 3) Hypothermia, resolved 4) CKD 5) Suspected RLL PNA 6) Acute delirium  PLAN/REC: Transfer out of ICU to med-surg Cont pip-tazo for presumed HCAP DC Vanc Low dose PRN Haloperidol ordered Discussed with Dr Darvin Neighbours. [facility] will assume care and PCCM off   Merton Border, MD PCCM service Mobile [phone] Pager [phone] 09/01/2016

## 2016-09-01 NOTE — Progress Notes (Signed)
Pharmacy Antibiotic Note  Hayley Campbell is a 82 y.o. female NH resident admitted on 08/31/2016 with AMS, respiratory distress, and hypotension.  Pharmacy has been consulted for piperacillin/tazobactam dosing for aspiration PNA. Now off vasopressors and being transferred to floor.  Plan: Continue piperacillin/tazobactam 3.375 g EI q 8 hours  Vanc has been d/c  Height: 5' (152.4 cm) Weight: 131 lb 13.4 oz (59.8 kg) IBW/kg (Calculated) : 45.5  Temp (24hrs), Avg:97.1 F (36.2 C), Min:92.7 F (33.7 C), Max:100.2 F (37.9 C)   Recent Labs Lab 08/31/16 1251 08/31/16 2023 09/01/16 0352  WBC 10.1 4.4 9.3  CREATININE 0.97 0.99 1.01*  LATICACIDVEN 1.3  --   --     Estimated Creatinine Clearance: 29.3 mL/min (by C-G formula based on SCr of 1.01 mg/dL (H)).    Allergies  Allergen Reactions  . Darvon [Propoxyphene] Other (See Comments)    Reaction: unknown    Antimicrobials this admission: Vancomycin 12/27 >> 12/28 Piperacillin/tazobactam 12/27 >>   Dose adjustments this admission:  Microbiology results: 12/27 BCx: pending NG < 24 hours 12/27 UCx: NG 12/27 MRSA PCR: negative  Thank you for allowing pharmacy to be a part of this patient's care.  Darrow Bussing, PharmD Pharmacy Resident 09/01/2016 3:38 PM

## 2016-09-02 DIAGNOSIS — R0609 Other forms of dyspnea: Secondary | ICD-10-CM

## 2016-09-02 DIAGNOSIS — R0600 Dyspnea, unspecified: Secondary | ICD-10-CM

## 2016-09-02 DIAGNOSIS — Z515 Encounter for palliative care: Secondary | ICD-10-CM

## 2016-09-02 DIAGNOSIS — Z66 Do not resuscitate: Secondary | ICD-10-CM

## 2016-09-02 MED ORDER — LORAZEPAM 1 MG PO TABS
1.0000 mg | ORAL_TABLET | Freq: Four times a day (QID) | ORAL | Status: DC | PRN
  Administered 2016-09-02: 1 mg via ORAL
  Filled 2016-09-02: qty 1

## 2016-09-02 MED ORDER — LORAZEPAM 2 MG/ML IJ SOLN
1.0000 mg | INTRAMUSCULAR | Status: DC | PRN
  Administered 2016-09-02 – 2016-09-03 (×5): 1 mg via INTRAVENOUS
  Filled 2016-09-02 (×5): qty 1

## 2016-09-02 MED ORDER — MORPHINE SULFATE (CONCENTRATE) 10 MG/0.5ML PO SOLN
5.0000 mg | ORAL | Status: DC | PRN
  Administered 2016-09-02: 5 mg via ORAL
  Filled 2016-09-02: qty 1

## 2016-09-02 NOTE — Progress Notes (Signed)
New hospice home referral received from [place]. Ms. Maita is a 82 year old woman admitted to [facility] on 12/27 from [facility] memory care  for evaluation of altered mental status, respiratory distress and hypotension. She has a known history of dementia, HLD, GERD, CKD stage III, cancer of the left breast and HTN.  Chest xray in the ED was negative for pneumonia, however aspiration pneumonia was suspected. Critical care NP spoke with family and a follow up meeting with Palliative Medicine NP confirmed the desire to focus on comfort. [facility] has requested transfer to the hospice home. Writer met in the room with patient's niece and Lucy Chris to initiate education regarding hospice services, philosophy and team approach to care with good understanding voiced. Questions answered, consents signed. Patient remained asleep through out visit and did not participate in the discussion.  Patient information faxed to referral. Plan is for discharge to the hospice home tomorrow via EMS with signed portable DNR in place. Staff nurse will need to call report to [phone]. Hospital SW to arrange transport. CSW Barn[place]all Sample aware.  Flo Shanks RN, BSN, [facility] [place], hospital  Liaison [phone] c

## 2016-09-02 NOTE — Progress Notes (Signed)
Palliative Medicine Consult Order Noted. Due to high referral volume and limited staffing, there will be a delay seeing this patient. Palliative Medicine Provider will return to [facility] on 09/06/2016, and patient will be evaluated then. Please call the Palliative Medicine Team office at [phone] if recommendations are needed in the interim.  Thank you for inviting [place] to see this patient.  Marjie Skiff Raysha Tilmon, RN, BSN, [facility] 09/02/2016 10:40 AM Cell [phone] 8:00-4:00 Monday-Friday Office [phone]

## 2016-09-02 NOTE — Progress Notes (Signed)
Clinical Education officer, museum (CSW) received consult from hospice facility placement. Clinical Social Worker (CSW) met with patient and her niece/ HPOA Hayley Campbell was at bedside. CSW discussed hospice placement. Niece chose [facility]. CSW left [facility] liaison a voicemail making her aware of above.   [employer], LCSW [phone]

## 2016-09-02 NOTE — Progress Notes (Signed)
Nutrition Brief Note  Chart reviewed. Pt now transitioning to comfort care.  No further nutrition interventions warranted at this time.  Please re-consult as needed.   Akylah Hascall M. Ilianna Bown, MS, RD LDN Inpatient Clinical Dietitian Pager [phone]    

## 2016-09-02 NOTE — Progress Notes (Signed)
[facility] [place] NAME: Hayley Campbell    MR#:  [id number]  DATE OF BIRTH:  06-Dec-1922  SUBJECTIVE:  CHIEF COMPLAINT:   Chief Complaint  Patient presents with  . Code Sepsis   The patient is a 82 year old Caucasian female with past medical history significant for history of breast cancer, CK D stage III, dementia, gastroesophageal reflux disease, hypertension, hyperlipidemia, who presented to the hospital with complaints of hypothermia, cough, decreased mental status. Her chest x-ray was concerning for right lung base pneumonia, she was hypotensive requiring pressors, she was resuscitated with IV fluids and her blood pressure improved, she was initiated on Zosyn and admitted to the hospital. After discussion with patient's family, family decided on comfort care measures.  Patient was transferred from ICU to regular medical floor. She is unable to provide review of systems due to dementia Review of Systems  Unable to perform ROS: Dementia    VITAL SIGNS: Blood pressure (!) 96/45, pulse 89, temperature 99.5 F (37.5 C), temperature source Oral, resp. rate 20, height 5' (1.524 m), weight 59.8 kg (131 lb 13.4 oz), SpO2 92 %.  PHYSICAL EXAMINATION:   GENERAL:  82 y.o.-year-old patient lying in the bed with no acute distress, restless, uncomfortable, dyspneic.  EYES: Pupils equal, round, reactive to light and accommodation. No scleral icterus. Extraocular muscles intact.  HEENT: Head atraumatic, normocephalic. Oropharynx and nasopharynx clear.  NECK:  Supple, no jugular venous distention. No thyroid enlargement, no tenderness.  LUNGS: Decreased breath sounds bilaterally, no wheezing, bilateral rales,rhonchi and crepitation noted, especially on the right lung anteriorlys .  Intermittent use of accessory muscles of respiration.  CARDIOVASCULAR: S1, S2 normal. No murmurs, rubs, or gallops.  ABDOMEN: Soft, nontender, nondistended, Patient is  uncomfortable to touch, pushes away examiner. His hands . Bowel sounds present. No organomegaly or mass.  EXTREMITIES: No pedal edema, cyanosis, or clubbing.  NEUROLOGIC: Cranial nerves II through XII are grossly  intact. Muscle strength  unable to evaluate in all extremities, But able to use all extremities . Sensatiounable to evaluate . Gait not checked.  PSYCHIATRIC: The patient is somnolent, not responding well, except with the stimulation, not able to assess orientation SKIN: No obvious rash, lesion, or ulcer.   ORDERS/RESULTS REVIEWED:   CBC  Recent Labs Lab 08/31/16 1251 08/31/16 2023 09/01/16 0352  WBC 10.1 4.4 9.3  HGB 11.5* 9.5* 10.7*  HCT 35.4 29.2* 32.2*  PLT 214 180 200  MCV 85.9 85.2 86.2  MCH 28.0 27.8 28.5  MCHC 32.6 32.6 33.1  RDW 17.6* 17.6* 17.3*  LYMPHSABS 0.4*  --   --   MONOABS 0.4  --   --   EOSABS 0.0  --   --   BASOSABS 0.0  --   --    ------------------------------------------------------------------------------------------------------------------  Chemistries   Recent Labs Lab 08/31/16 1251 08/31/16 2023 09/01/16 0352  NA 144 143 144  K 4.9 3.9 3.7  CL 113* 117* 117*  CO2 23 22 22   GLUCOSE 78 163* 142*  BUN 44* 36* 30*  CREATININE 0.97 0.99 1.01*  CALCIUM 10.0 8.4* 8.6*  MG  --   --  1.7  AST 42*  --   --   ALT 24  --   --   ALKPHOS 122  --   --   BILITOT 1.1  --   --    ------------------------------------------------------------------------------------------------------------------ estimated creatinine clearance is 29.3 mL/min (by C-G formula based on SCr of 1.01 mg/dL (H)). ------------------------------------------------------------------------------------------------------------------  No results for input(s): TSH, T4TOTAL, T3FREE, THYROIDAB in the last 72 hours.  Invalid input(s): FREET3  Cardiac Enzymes  Recent Labs Lab 08/31/16 1543 08/31/16 2023 09/01/16 0352  TROPONINI <0.03 0.04* 0.04*    ------------------------------------------------------------------------------------------------------------------ Invalid input(s): POCBNP ---------------------------------------------------------------------------------------------------------------  RADIOLOGY: Dg Chest 1 View  Result Date: 09/01/2016 CLINICAL DATA:  Dyspnea. EXAM: CHEST 1 VIEW COMPARISON:  Chest radiograph yesterday. FINDINGS: Lung volumes are low. Patient is rotated. Unchanged heart size and mediastinal contours allowing for differences in positioning. There is tortuosity of the thoracic aorta. New/increasing linear and patchy opacity at the right lung base. Infrahilar bronchial thickening is again seen. No evidence for pulmonary edema. An azygos fissure is noted. No large pleural effusion. IMPRESSION: New/increasing linear and patchy opacity at the right lung base, may be atelectasis, pneumonia, or aspiration. Electronically Signed   By: Jeb Levering M.D.   On: 09/01/2016 06:52    EKG:  Orders placed or performed during the hospital encounter of 08/31/16  . EKG 12-Lead  . EKG 12-Lead    ASSESSMENT AND PLAN:  Active Problems:   Septic shock ([place])    #1. Septic shock due to pneumonia, continue Zosyn, IV fluids, blood pressure has improved, blood cultures are negative so far, as well as urinary cultures, will discontinue IV fluids as patient discomfort Care now  #2. Acute Metabolic encephalopathy in the setting of dementia, supportive therapy, aspiration precautions, IV fluids, patient's family decided on comfort care measures, patient will be discharged to hospice home versus back to skilled nursing facility with hospice within the next 1-2 days   #3. Pneumonia, continue Zosyn, blood cultures are negative, sputum cultures are not obtained, suspected aspiration pneumonitis  #4. Acute renal insufficiency, likely due to hypotension, no further interventions, continue Foley catheter  #5. Elevated troponin, likely  demand ischemia, no further interventions, no beta blockers, calcium channel blockers, aspirin, due to nothing by mouth status and hypotension   Management plans discussed with the patient, family and they are in agreement.   DRUG ALLERGIES:  Allergies  Allergen Reactions  . Darvon [Propoxyphene] Other (See Comments)    Reaction: unknown    CODE STATUS:     Code Status Orders        Start     Ordered   08/31/16 1517  Do not attempt resuscitation (DNR)  Continuous    Question Answer Comment  In the event of cardiac or respiratory ARREST Do not call a "code blue"   In the event of cardiac or respiratory ARREST Do not perform Intubation, CPR, defibrillation or ACLS   In the event of cardiac or respiratory ARREST Use medication by any route, position, wound care, and other measures to relive pain and suffering. May use oxygen, suction and manual treatment of airway obstruction as needed for comfort.      08/31/16 1520    Code Status History    Date Active Date Inactive Code Status Order ID Comments User Context   07/09/2016  4:57 AM 07/10/2016  5:45 PM DNR [id number]  Harrie Foreman, MD ED   04/16/2016 11:34 PM 04/18/2016  7:58 PM DNR [id number]  Lance Coon, MD Inpatient      TOTAL TIME TAKING CARE OF THIS PATIENT: 35 minutes.   Discussed with palliative care  Fayelynn Distel M.D on 09/02/2016 at 2:49 PM  Between 7am to 6pm - Pager - [phone]  After 6pm go to [email or web address] - password [email or web address] [facility]  [facility]  Office  [phone]  CC: Primary care  physician; No PCP Per Patient

## 2016-09-02 NOTE — Consult Note (Signed)
Consultation Note Date: 09/02/2016   Patient Name: Hayley Campbell  DOB: September 05, 1923  MRN: [id number]  Age / Sex: 82 y.o., female  PCP: No Pcp Per Patient Referring Physician: Theodoro Grist, MD  Reason for Consultation: Establishing goals of care and Psychosocial/spiritual support  HPI/Patient Profile: 82 y.o. female admitted on 08/31/2016 with PMH of es-dementia. Patient is a resident of an AL/memory unit.  Presetned to ER with altered mental status, respiratory distress and hypotension. CXR image reviewed;  no strong evidence of pneumonia, but patient is at high risk of aspiration.  Empiric abx for aspiration pneumonia initiated. Hypotension likely 2/2 to Septic shock, source uncertain, possibly aspiration pneumonia vs UTI. Several hospitalization in the past few months.  Continued physical, functional and cognitive decline.  Family faced with advanced directives and anticipatory care needs   Clinical Assessment and Goals of Care:  This NP Wadie Lessen reviewed medical records, received report from team, assessed the patient and then meet at the patient's bedside along with her HPOA/neice/Hayley Campbell  to discuss diagnosis, prognosis, GOC, EOL wishes disposition and options.  A  discussion was had today regarding advanced directives.  Concepts specific to code status, artifical feeding and hydration, continued IV antibiotics and rehospitalization was had.  The difference between a aggressive medical intervention path  and a palliative comfort care path for this patient at this time was had.  Values and goals of care important to patient and family were attempted to be elicited.  Concept of Hospice and Palliative Care were discussed  Natural trajectory and expectations at EOL were discussed.  Questions and concerns addressed.   Family encouraged to call with questions or concerns.  PMT will continue to  support holistically.    HCPOA, niece / Hayley Campbell    SUMMARY OF RECOMMENDATIONS    Family has made decision for shift to full comfort and are hopeful for hospice facility, will write for choice  Code Status/Advance Care Planning:  DNR    Symptom Management:   Pain/Dyspnea: Roxanol 5 mg po/sl every 1 hr prn  Agitation: Ativan 1 mg po/sl every 6 hrs prn  Palliative Prophylaxis:   Aspiration, Bowel Regimen, Frequent Pain Assessment and Oral Care  Additional Recommendations (Limitations, Scope, Preferences):  Full Comfort Care  Psycho-social/Spiritual:   Desire for further Chaplaincy support:no  Additional Recommendations: Education on Hospice  Prognosis:   < 2 weeks- patient is not taking any POs, is minimally responsive, no life prolonging measures. (fluids, antibiotics stopped, no diagnostics)  Discharge Planning: Hospice facility      Primary Diagnoses: Present on Admission: . Septic shock ([place])   I have reviewed the medical record, interviewed the patient and family, and examined the patient. The following aspects are pertinent.  Past Medical History:  Diagnosis Date  . Cancer [facility]) 2014   left breast cancer  . CKD (chronic kidney disease), stage III   . Dementia   . GERD (gastroesophageal reflux disease)   . HLD (hyperlipidemia)   . HTN (hypertension)   . HTN (hypertension)  Social History   Social History  . Marital status: Single    Spouse name: N/A  . Number of children: N/A  . Years of education: N/A   Social History Main Topics  . Smoking status: Never Smoker  . Smokeless tobacco: Never Used  . Alcohol use No  . Drug use: No  . Sexual activity: No   Other Topics Concern  . None   Social History Narrative  . None   Family History  Problem Relation Age of Onset  . Family history unknown: Yes   Scheduled Meds: . mouth rinse  15 mL Mouth Rinse BID   Continuous Infusions: PRN Meds:.acetaminophen, LORazepam, morphine  CONCENTRATE, ondansetron (ZOFRAN) IV Medications Prior to Admission:  Prior to Admission medications   Medication Sig Start Date End Date Taking? Authorizing Provider  Cholecalciferol (VITAMIN D3) 50000 units CAPS Take 50,000 Units by mouth every 30 (thirty) days. On the 14th of every month   Yes Historical Provider, MD  feeding supplement, ENSURE ENLIVE, (ENSURE ENLIVE) LIQD Take 237 mLs by mouth 2 (two) times daily between meals. 04/18/16  Yes Vaughan Basta, MD  guaiFENesin (MUCINEX) 600 MG 12 hr tablet Take 600 mg by mouth 2 (two) times daily.   Yes Historical Provider, MD  letrozole (FEMARA) 2.5 MG tablet Take 2.5 mg by mouth daily.   Yes Historical Provider, MD  lisinopril (PRINIVIL,ZESTRIL) 20 MG tablet Take 20 mg by mouth daily.    Yes Historical Provider, MD  Melatonin 1 MG TABS Take 1 mg by mouth at bedtime as needed.   Yes Historical Provider, MD  ranitidine (ZANTAC) 75 MG tablet Take 75 mg by mouth 2 (two) times daily.   Yes Historical Provider, MD   Allergies  Allergen Reactions  . Darvon [Propoxyphene] Other (See Comments)    Reaction: unknown   Review of Systems  Unable to perform ROS: Patient unresponsive    Physical Exam  Constitutional: She appears ill.  HENT:  Mouth/Throat: Oropharynx is clear and moist.  Cardiovascular: Tachycardia present.   Pulmonary/Chest: She has decreased breath sounds in the right lower field and the left lower field.  Neurological: She is unresponsive.  Skin: Skin is warm and dry.    Vital Signs: BP (!) 133/96 (BP Location: Right Arm)   Pulse 100   Temp 99.5 F (37.5 C) (Oral)   Resp 20   Ht 5' (1.524 m)   Wt 59.8 kg (131 lb 13.4 oz)   SpO2 92%   BMI 25.75 kg/m  Pain Assessment: PAINAD       SpO2: SpO2: 92 % O2 Device:SpO2: 92 % O2 Flow Rate: .O2 Flow Rate (L/min): 2 L/min  IO: Intake/output summary: No intake or output data in the 24 hours ending 09/02/16 0958  LBM:   Baseline Weight: Weight: 54.4 kg (120  lb) Most recent weight: Weight: 59.8 kg (131 lb 13.4 oz)      Palliative Assessment/Data: 20 % at best   Discussed with Dr Ether Griffins  Time In: 0915 Time Out: 1030 Time Total: 75 min Greater than 50%  of this time was spent counseling and coordinating care related to the above assessment and plan.  Signed by: Wadie Lessen, NP   Please contact Palliative Medicine Team phone at [phone] for questions and concerns.  For individual provider: See Shea Evans

## 2016-09-02 NOTE — Clinical Social Work Note (Signed)
CSW informed by Santiago Glad with [facility] that she has spoken to patient and family and they have decided upon hospice home. Santiago Glad stated that she has arranged for patient to go to hospice home tomorrow: 12/30. Shela Leff MSW,LCSW [phone]

## 2016-09-03 MED ORDER — LORAZEPAM 1 MG PO TABS: 1.0000 mg | ORAL_TABLET | Freq: Four times a day (QID) | ORAL | 0 refills | Status: AC | PRN

## 2016-09-03 MED ORDER — MORPHINE SULFATE (CONCENTRATE) 10 MG/0.5ML PO SOLN: 5.0000 mg | ORAL | 0 refills | Status: AC | PRN

## 2016-09-03 NOTE — Progress Notes (Signed)
Patient discharged to [facility] via EMS. Niece at bedside and aware. No distress noted of patient at this time. Madlyn Frankel, RN

## 2016-09-03 NOTE — Clinical Social Work Note (Signed)
[facility] via non-emergent EMS. Facility and family are aware. CSW will con't to follow pending additional dc needs.  Santiago Bumpers, MSW, LCSW-A [phone]

## 2016-09-03 NOTE — Discharge Summary (Signed)
Hayley Campbell, is a 82 y.o. female  DOB October 05, 1922  MRN [id number].  Admission date:  08/31/2016  Admitting Physician  Laverle Hobby, MD  Discharge Date:  09/03/2016   Primary MD  No PCP Per Patient  Recommendations for primary care physician for things to follow:   Going to hospice home.   Admission Diagnosis  Dyspnea [R06.00] Hypothermia, initial encounter [T68.XXXA] Sepsis, due to unspecified organism [facility]) [A41.9] Hypotension, unspecified hypotension type [I95.9]   Discharge Diagnosis  Dyspnea [R06.00] Hypothermia, initial encounter [T68.XXXA] Sepsis, due to unspecified organism ([place]) [A41.9] Hypotension, unspecified hypotension type [I95.9]    Active Problems:   Septic shock ([place])   DNR (do not resuscitate)   Palliative care by specialist   Dyspnea      Past Medical History:  Diagnosis Date  . Cancer [facility]) 2014   left breast cancer  . CKD (chronic kidney disease), stage III   . Dementia   . GERD (gastroesophageal reflux disease)   . HLD (hyperlipidemia)   . HTN (hypertension)   . HTN (hypertension)     Past Surgical History:  Procedure Laterality Date  . none         History of present illness and  Hospital Course:     Kindly see H&P for history of present illness and admission details, please review complete Labs, Consult reports and Test reports for all details in brief  HPI  from the history and physical done on the day of admission The patient is a 82 year old Caucasian female with past medical history significant for history of breast cancer, CK D stage III, dementia, gastroesophageal reflux disease, hypertension, hyperlipidemia, who presented to the hospital with complaints of hypothermia, cough, decreased mental status. Her chest x-ray was concerning for right lung base  pneumonia,    Hospital Course  #1. Septic shock due to pneumonia; received  antibiotics, IV fluids, pressors. #2. Acute Metabolic encephalopathy in the setting of dementia, supportive therapy, aspiration precautions, , patient's family decided on comfort care measures, patient will be discharged to hospice home today.   #4. Acute renal insufficiency, likely due to hypotension, no further interventions, continue Foley catheter  #5. Elevated troponin, likely demand ischemia, no further interventions, no beta blockers, calcium channel blockers, aspirin, due to nothing by mouth status and hypotension     Discharge Condition: Stable   Follow UP      Discharge Instructions  and  Discharge Medications      Allergies as of 09/03/2016      Reactions   Darvon [propoxyphene] Other (See Comments)   Reaction: unknown      Medication List    STOP taking these medications   feeding supplement (ENSURE ENLIVE) Liqd   letrozole 2.5 MG tablet Commonly known as:  FEMARA   lisinopril 20 MG tablet Commonly known as:  PRINIVIL,ZESTRIL   Melatonin 1 MG Tabs   Vitamin D3 50000 units Caps     TAKE these medications   guaiFENesin 600 MG 12 hr tablet Commonly known as:  MUCINEX Take 600 mg by mouth 2 (two) times daily.   LORazepam 1 MG tablet Commonly known as:  ATIVAN Take 1 tablet (1 mg total) by mouth every 6 (six) hours as needed for anxiety.   morphine CONCENTRATE 10 MG/0.5ML Soln concentrated solution Take 0.25 mLs (5 mg total) by mouth every hour as needed for moderate pain or shortness of breath.   ranitidine 75 MG tablet Commonly known as:  ZANTAC Take 75 mg by mouth  2 (two) times daily.         Diet and Activity recommendation: See Discharge Instructions above   Consults obtained - Palliative care, social worker   Major procedures and Radiology Reports - PLEASE review detailed and final reports for all details, in brief -      Dg Chest 1  View  Result Date: 09/01/2016 CLINICAL DATA:  Dyspnea. EXAM: CHEST 1 VIEW COMPARISON:  Chest radiograph yesterday. FINDINGS: Lung volumes are low. Patient is rotated. Unchanged heart size and mediastinal contours allowing for differences in positioning. There is tortuosity of the thoracic aorta. New/increasing linear and patchy opacity at the right lung base. Infrahilar bronchial thickening is again seen. No evidence for pulmonary edema. An azygos fissure is noted. No large pleural effusion. IMPRESSION: New/increasing linear and patchy opacity at the right lung base, may be atelectasis, pneumonia, or aspiration. Electronically Signed   By: Jeb Levering M.D.   On: 09/01/2016 06:52   Dg Chest Port 1 View  Result Date: 08/31/2016 CLINICAL DATA:  Upper respiratory infection EXAM: PORTABLE CHEST 1 VIEW COMPARISON:  None. FINDINGS: Cardiomediastinal silhouette is unremarkable. No pulmonary edema. No segmental infiltrate. Mild infrahilar bronchitic changes. IMPRESSION: No segmental infiltrate or pulmonary edema. Mild infrahilar bronchitic changes. Electronically Signed   By: Lahoma Crocker M.D.   On: 08/31/2016 14:38    Micro Results    Recent Results (from the past 240 hour(s))  Blood culture (routine x 2)     Status: None (Preliminary result)   Collection Time: 08/31/16 12:49 PM  Result Value Ref Range Status   Specimen Description BLOOD RIGHT HAND  Final   Special Requests   Final    BOTTLES DRAWN AEROBIC AND ANAEROBIC AER 9ML ANA 10ML   Culture NO GROWTH 3 DAYS  Final   Report Status PENDING  Incomplete  Blood culture (routine x 2)     Status: None (Preliminary result)   Collection Time: 08/31/16  1:33 PM  Result Value Ref Range Status   Specimen Description BLOOD LEFT WRIST  Final   Special Requests   Final    BOTTLES DRAWN AEROBIC AND ANAEROBIC AER 2ML ANA 1ML   Culture NO GROWTH 3 DAYS  Final   Report Status PENDING  Incomplete  Urine culture     Status: None   Collection Time:  08/31/16  1:35 PM  Result Value Ref Range Status   Specimen Description URINE, RANDOM  Final   Special Requests NONE  Final   Culture NO GROWTH Performed at [facility]   Final   Report Status 09/01/2016 FINAL  Final  MRSA PCR Screening     Status: None   Collection Time: 08/31/16 10:54 PM  Result Value Ref Range Status   MRSA by PCR NEGATIVE NEGATIVE Final    Comment:        The GeneXpert MRSA Assay (FDA approved for NASAL specimens only), is one component of a comprehensive MRSA colonization surveillance program. It is not intended to diagnose MRSA infection nor to guide or monitor treatment for MRSA infections.        Today   Subjective:   Hayley Campbell today,Stable for discharge to hospice home  Objective:   Blood pressure (!) 112/50, pulse 95, temperature 99.2 F (37.3 C), temperature source Axillary, resp. rate 20, height 5' (1.524 m), weight 59.8 kg (131 lb 13.4 oz), SpO2 (!) 89 %.  No intake or output data in the 24 hours ending 09/03/16 1050  Exam GENERAL:  82  y.o.-year-old patient lying in the bed with no acute distress, restless, uncomfortable, dyspneic.  EYES: Pupils equal, round, reactive to light and accommodation. No scleral icterus. Extraocular muscles intact.  HEENT: Head atraumatic, normocephalic. Oropharynx and nasopharynx clear.  NECK:  Supple, no jugular venous distention. No thyroid enlargement, no tenderness.  LUNGS: Decreased breath sounds bilaterally, no wheezing, bilateral rales,rhonchi and crepitation noted, especially on the right lung anteriorlys .  Intermittent use of accessory muscles of respiration.  CARDIOVASCULAR: S1, S2 normal. No murmurs, rubs, or gallops.  ABDOMEN: Soft, nontender, nondistended, Patient is uncomfortable to touch, pushes away examiner. His hands . Bowel sounds present. No organomegaly or mass.  EXTREMITIES: No pedal edema, cyanosis, or clubbing.  NEUROLOGIC: Cranial nerves II through XII are grossly   intact. Muscle strength  unable to evaluate in all extremities, But able to use all extremities . Sensatiounable to evaluate . Gait not checked.  PSYCHIATRIC: The patient is somnolent, not responding well, except with the stimulation, not able to assess orientation SKIN: No obvious rash, lesion, or ulcer  Data Review   CBC w Diff:  Lab Results  Component Value Date   WBC 9.3 09/01/2016   HGB 10.7 (L) 09/01/2016   HCT 32.2 (L) 09/01/2016   PLT 200 09/01/2016   LYMPHOPCT 4 08/31/2016   MONOPCT 4 08/31/2016   EOSPCT 0 08/31/2016   BASOPCT 0 08/31/2016    CMP:  Lab Results  Component Value Date   NA 144 09/01/2016   K 3.7 09/01/2016   CL 117 (H) 09/01/2016   CO2 22 09/01/2016   BUN 30 (H) 09/01/2016   CREATININE 1.01 (H) 09/01/2016   PROT 7.0 08/31/2016   ALBUMIN 3.3 (L) 08/31/2016   BILITOT 1.1 08/31/2016   ALKPHOS 122 08/31/2016   AST 42 (H) 08/31/2016   ALT 24 08/31/2016  .   Total Time in preparing paper work, data evaluation and todays exam - 59 minutes  Tamalyn Wadsworth M.D on 09/03/2016 at 10:50 AM    Note: This dictation was prepared with Dragon dictation along with smaller phrase technology. Any transcriptional errors that result from this process are unintentional.

## 2016-09-03 NOTE — Progress Notes (Signed)
Report given to [place] at [place]. Madlyn Frankel, RN

## 2016-09-03 NOTE — Progress Notes (Signed)
EMS called for transport. Sury Wentworth S, RN  

## 2016-09-03 NOTE — Progress Notes (Signed)
Pt trembling, possibly having seizure after being turned and brief changed.  After 10 minutes, gave Ativian IVP with small change in "jerking".  Gave Morphine oral for possible pain.  Pt then became calm and has been sleeping quietly since. Dorna Bloom RN

## 2016-09-05 LAB — CULTURE, BLOOD (ROUTINE X 2)
CULTURE: NO GROWTH
Culture: NO GROWTH

## 2016-09-12 ENCOUNTER — Telehealth: Payer: Self-pay | Admitting: Internal Medicine

## 2016-09-12 NOTE — Telephone Encounter (Signed)
[facility] calling to let us know patient passed at 11:50am today  Would like [place] to know

## 2016-09-12 NOTE — Telephone Encounter (Signed)
Please see message below

## 2016-10-06 DEATH — deceased

## 2017-02-04 IMAGING — DX DG CHEST 1V
1 series · 1 of 1 positions shown · non-contrast
Comparison: Chest radiograph yesterday.

CLINICAL DATA: Dyspnea.

EXAM:
CHEST 1 VIEW

[chest ap]
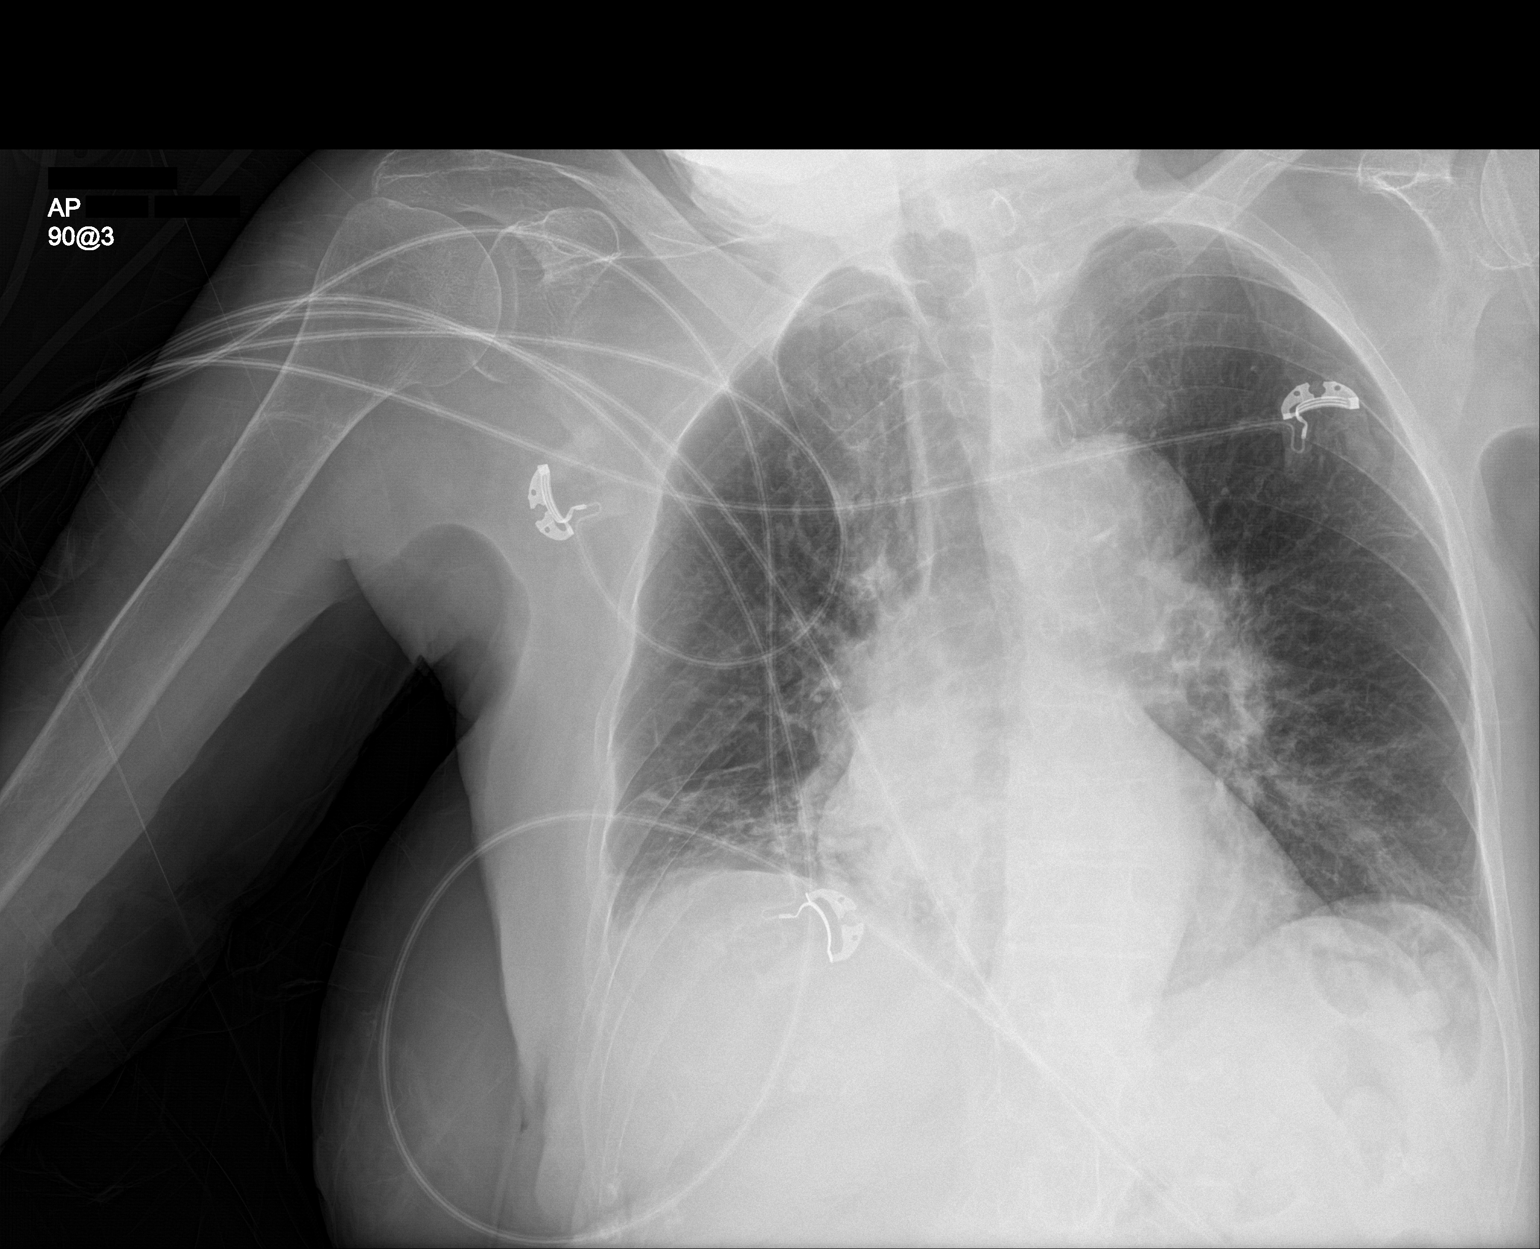

[1 of 1 positions shown; findings below may reference images not displayed]

FINDINGS: Lung volumes are low. Patient is rotated. Unchanged heart size and
mediastinal contours allowing for differences in positioning. There
is tortuosity of the thoracic aorta. New/increasing linear and
patchy opacity at the right lung base. Infrahilar bronchial
thickening is again seen. No evidence for pulmonary edema. An azygos
fissure is noted. No large pleural effusion.
IMPRESSION: New/increasing linear and patchy opacity at the right lung base, may
be atelectasis, pneumonia, or aspiration.
